# Patient Record
Sex: Male | Born: 1955 | Race: Black or African American | Hispanic: No | Marital: Married | Smoking: Never smoker
Health system: Southern US, Community
[De-identification: ages and names within clinical notes are randomized; demographics above are authoritative.]

## PROBLEM LIST (undated history)

## (undated) DIAGNOSIS — I509 Heart failure, unspecified: Secondary | ICD-10-CM

## (undated) DIAGNOSIS — I10 Essential (primary) hypertension: Secondary | ICD-10-CM

## (undated) HISTORY — DX: Heart failure, unspecified: I50.9

## (undated) HISTORY — DX: Essential (primary) hypertension: I10

---

## 2015-06-18 DIAGNOSIS — G4733 Obstructive sleep apnea (adult) (pediatric): Secondary | ICD-10-CM | POA: Insufficient documentation

## 2015-12-20 DIAGNOSIS — I10 Essential (primary) hypertension: Secondary | ICD-10-CM | POA: Insufficient documentation

## 2017-11-11 ENCOUNTER — Emergency Department: Payer: BLUE CROSS/BLUE SHIELD

## 2017-11-11 ENCOUNTER — Emergency Department
Admission: EM | Admit: 2017-11-11 | Discharge: 2017-11-11 | Disposition: A | Discharge status: Home / Self Care | Payer: BLUE CROSS/BLUE SHIELD | Attending: Student in an Organized Health Care Education/Training Program | Admitting: Student in an Organized Health Care Education/Training Program

## 2017-11-11 ENCOUNTER — Other Ambulatory Visit: Payer: Self-pay

## 2017-11-11 DIAGNOSIS — K5900 Constipation, unspecified: Secondary | ICD-10-CM | POA: Diagnosis present

## 2017-11-11 DIAGNOSIS — K602 Anal fissure, unspecified: Secondary | ICD-10-CM

## 2017-11-11 DIAGNOSIS — K600 Acute anal fissure: Secondary | ICD-10-CM | POA: Insufficient documentation

## 2017-11-11 MED ORDER — BISACODYL 10 MG RE SUPP
10.0000 mg | Freq: Once | RECTAL | Status: AC
  Administered 2017-11-11: 10 mg via RECTAL
  Filled 2017-11-11: qty 1

## 2017-11-11 MED ORDER — POLYETHYLENE GLYCOL 3350 17 G PO PACK: 17.0000 g | PACK | Freq: Every day | ORAL | 0 refills | Status: DC

## 2017-11-11 NOTE — ED Triage Notes (Signed)
Pt in with co constipation for 2 days. States normally has BM daily. Co rectal pain when trying have BM and blood only when he wipes. No abd pain, has not tried any otc meds.

## 2017-11-11 NOTE — ED Provider Notes (Signed)
[facility] Emergency Department Provider Note    None    (approximate)  I have reviewed the triage vital signs and the nursing notes.   HISTORY  Chief Complaint Constipation    HPI Jonathan Duncan is a 63 y.o. male presents with chief complaint of constipation and difficulty moving his bowels for the past 2 days.  Typically states that he is normally regular but he was having trouble moving his bowels and had to try to use the restroom twice today.  States the feelings that he was straining had some pain in his bottom and then when he went to wipe he had noticed blood on the tissue paper twice.  No vomiting.  No melena.  No fevers.  No changes any medications.  No past medical history on file. No family history on file.  There are no active problems to display for this patient.     Prior to Admission medications   Not on File    Allergies Patient has no known allergies.    Social History Social History   Tobacco Use  . Smoking status: Not on file  Substance Use Topics  . Alcohol use: Not on file  . Drug use: Not on file    Review of Systems Patient denies headaches, rhinorrhea, blurry vision, numbness, shortness of breath, chest pain, edema, cough, abdominal pain, nausea, vomiting, diarrhea, dysuria, fevers, rashes or hallucinations unless otherwise stated above in HPI. ____________________________________________   PHYSICAL EXAM:  VITAL SIGNS: Vitals:   11/11/17 1934 11/11/17 1935  BP: (!) 138/113 137/77  Pulse:    Resp:    Temp:    SpO2:      Constitutional: Alert and oriented. Well appearing and in no acute distress. Eyes: Conjunctivae are normal.  Head: Atraumatic. Nose: No congestion/rhinnorhea. Mouth/Throat: Mucous membranes are moist.   Neck: No stridor. Painless ROM.  Cardiovascular: Normal rate, regular rhythm. Grossly normal heart sounds.  Good peripheral circulation. Respiratory: Normal respiratory effort.  No  retractions. Lungs CTAB. Gastrointestinal: Soft and nontender. No distention. No abdominal bruits. No CVA tenderness. Genitourinary: Normal external genitalia with small nonthrombosed hemorrhoid.  Probable anal fissure she does have some pain on digital exam but no fecal impaction. Musculoskeletal: No lower extremity tenderness nor edema.  No joint effusions. Neurologic:  Normal speech and language. No gross focal neurologic deficits are appreciated. No facial droop Skin:  Skin is warm, dry and intact. No rash noted. Psychiatric: Mood and affect are normal. Speech and behavior are normal.  ____________________________________________   LABS (all labs ordered are listed, but only abnormal results are displayed)  No results found for this or any previous visit (from the past 24 hour(s)). ____________________________________________ ____________________________________________  RADIOLOGY  I personally reviewed all radiographic images ordered to evaluate for the above acute complaints and reviewed radiology reports and findings.  These findings were personally discussed with the patient.  Please see medical record for radiology report. ____________________________________________   PROCEDURES  Procedure(s) performed:  Procedures    Critical Care performed: no ____________________________________________   INITIAL IMPRESSION / ASSESSMENT AND PLAN / ED COURSE  Pertinent labs & imaging results that were available during my care of the patient were reviewed by me and considered in my medical decision making (see chart for details).  DDX: Proctitis, constipation, anal fissure, hemorrhoid, diverticular bleed  Jonathan Duncan is a 62 y.o. who presents to the ED with symptoms as described above.  Most consistent with constipation with anal fissure based on exam.  His abdominal exam is soft and benign.  Patient does endorse decreased fluid intake over the past few days.  X-ray shows no  obstructive process.  Amount of blood seems scant and minimal.  There is no blood on digital exam therefore I do believe that this is appropriate for trial of outpatient management.  Have discussed with the patient and available family all diagnostics and treatments performed thus far and all questions were answered to the best of my ability. The patient demonstrates understanding and agreement with plan.       ____________________________________________   FINAL CLINICAL IMPRESSION(S) / ED DIAGNOSES  Final diagnoses:  Constipation, unspecified constipation type  Anal fissure      NEW MEDICATIONS STARTED DURING THIS VISIT:  New Prescriptions   No medications on file     Note:  This document was prepared using Dragon voice recognition software and may include unintentional dictation errors.    Jonathan Duncan, Jonathan Hovanec, MD 11/11/17 2116

## 2017-11-11 NOTE — Discharge Instructions (Signed)
Anal fissure may occur when passing hard or large stools.  An anal fissure can cause pain and bleeding during bowel movements.  This condition usually heals on its own in four to six weeks. Common treatments include dietary fiber, stool softeners, as well as creams to the affected area.

## 2017-11-11 NOTE — ED Notes (Signed)
Rectal exam performed by Dr. Roxan Hockeyobinson and this nurse assisted.

## 2021-04-17 ENCOUNTER — Emergency Department: Payer: Self-pay

## 2021-04-17 ENCOUNTER — Inpatient Hospital Stay
Admission: EM | Admit: 2021-04-17 | Discharge: 2021-04-19 | DRG: 291 | Disposition: A | Discharge status: Home / Self Care | Payer: Self-pay | Attending: Internal Medicine | Admitting: Internal Medicine

## 2021-04-17 ENCOUNTER — Other Ambulatory Visit: Payer: Self-pay

## 2021-04-17 DIAGNOSIS — R778 Other specified abnormalities of plasma proteins: Secondary | ICD-10-CM

## 2021-04-17 DIAGNOSIS — I248 Other forms of acute ischemic heart disease: Secondary | ICD-10-CM | POA: Diagnosis present

## 2021-04-17 DIAGNOSIS — I509 Heart failure, unspecified: Secondary | ICD-10-CM

## 2021-04-17 DIAGNOSIS — J9601 Acute respiratory failure with hypoxia: Secondary | ICD-10-CM | POA: Diagnosis present

## 2021-04-17 DIAGNOSIS — J810 Acute pulmonary edema: Secondary | ICD-10-CM

## 2021-04-17 DIAGNOSIS — Z9114 Patient's other noncompliance with medication regimen: Secondary | ICD-10-CM

## 2021-04-17 DIAGNOSIS — N1831 Chronic kidney disease, stage 3a: Secondary | ICD-10-CM | POA: Diagnosis present

## 2021-04-17 DIAGNOSIS — I161 Hypertensive emergency: Secondary | ICD-10-CM | POA: Diagnosis present

## 2021-04-17 DIAGNOSIS — E876 Hypokalemia: Secondary | ICD-10-CM | POA: Diagnosis present

## 2021-04-17 DIAGNOSIS — Z9119 Patient's noncompliance with other medical treatment and regimen: Secondary | ICD-10-CM

## 2021-04-17 DIAGNOSIS — I447 Left bundle-branch block, unspecified: Secondary | ICD-10-CM | POA: Diagnosis present

## 2021-04-17 DIAGNOSIS — I5023 Acute on chronic systolic (congestive) heart failure: Secondary | ICD-10-CM | POA: Diagnosis present

## 2021-04-17 DIAGNOSIS — Z8249 Family history of ischemic heart disease and other diseases of the circulatory system: Secondary | ICD-10-CM

## 2021-04-17 DIAGNOSIS — Z79899 Other long term (current) drug therapy: Secondary | ICD-10-CM

## 2021-04-17 DIAGNOSIS — I130 Hypertensive heart and chronic kidney disease with heart failure and stage 1 through stage 4 chronic kidney disease, or unspecified chronic kidney disease: Principal | ICD-10-CM | POA: Diagnosis present

## 2021-04-17 DIAGNOSIS — Z20822 Contact with and (suspected) exposure to covid-19: Secondary | ICD-10-CM | POA: Diagnosis present

## 2021-04-17 DIAGNOSIS — I429 Cardiomyopathy, unspecified: Secondary | ICD-10-CM | POA: Diagnosis present

## 2021-04-17 LAB — CBC
HCT: 40.6 % (ref 39.0–52.0)
Hemoglobin: 14.4 g/dL (ref 13.0–17.0)
MCH: 32.9 pg (ref 26.0–34.0)
MCHC: 35.5 g/dL (ref 30.0–36.0)
MCV: 92.7 fL (ref 80.0–100.0)
Platelets: 211 10*3/uL (ref 150–400)
RBC: 4.38 MIL/uL (ref 4.22–5.81)
RDW: 12.4 % (ref 11.5–15.5)
WBC: 9.5 10*3/uL (ref 4.0–10.5)
nRBC: 0 % (ref 0.0–0.2)

## 2021-04-17 LAB — TROPONIN I (HIGH SENSITIVITY)
Troponin I (High Sensitivity): 78 ng/L — ABNORMAL HIGH (ref <18)
Troponin I (High Sensitivity): 93 ng/L — ABNORMAL HIGH (ref <18)

## 2021-04-17 LAB — RESP PANEL BY RT-PCR (FLU A&B, COVID) ARPGX2
Influenza A by PCR: NEGATIVE
Influenza B by PCR: NEGATIVE
SARS Coronavirus 2 by RT PCR: NEGATIVE

## 2021-04-17 LAB — COMPREHENSIVE METABOLIC PANEL
ALT: 15 U/L (ref 0–44)
AST: 24 U/L (ref 15–41)
Albumin: 4 g/dL (ref 3.5–5.0)
Alkaline Phosphatase: 76 U/L (ref 38–126)
Anion gap: 10 (ref 5–15)
BUN: 19 mg/dL (ref 8–23)
CO2: 24 mmol/L (ref 22–32)
Calcium: 8.9 mg/dL (ref 8.9–10.3)
Chloride: 104 mmol/L (ref 98–111)
Creatinine, Ser: 1.28 mg/dL — ABNORMAL HIGH (ref 0.61–1.24)
GFR, Estimated: >60 mL/min (ref >60)
Glucose, Bld: 105 mg/dL — ABNORMAL HIGH (ref 70–99)
Potassium: 3.2 mmol/L — ABNORMAL LOW (ref 3.5–5.1)
Sodium: 138 mmol/L (ref 135–145)
Total Bilirubin: 1.1 mg/dL (ref 0.3–1.2)
Total Protein: 7.9 g/dL (ref 6.5–8.1)

## 2021-04-17 LAB — HIV ANTIBODY (ROUTINE TESTING W REFLEX): HIV Screen 4th Generation wRfx: NONREACTIVE

## 2021-04-17 LAB — PROTIME-INR
INR: 1 (ref 0.8–1.2)
Prothrombin Time: 13.5 seconds (ref 11.4–15.2)

## 2021-04-17 LAB — HEPARIN LEVEL (UNFRACTIONATED): Heparin Unfractionated: 0.1 IU/mL — ABNORMAL LOW (ref 0.30–0.70)

## 2021-04-17 LAB — TSH: TSH: 2.024 u[IU]/mL (ref 0.350–4.500)

## 2021-04-17 LAB — APTT: aPTT: 36 seconds (ref 24–36)

## 2021-04-17 LAB — MAGNESIUM: Magnesium: 2.1 mg/dL (ref 1.7–2.4)

## 2021-04-17 LAB — BRAIN NATRIURETIC PEPTIDE: B Natriuretic Peptide: 653.7 pg/mL — ABNORMAL HIGH (ref 0.0–100.0)

## 2021-04-17 MED ORDER — LABETALOL HCL 5 MG/ML IV SOLN
20.0000 mg | INTRAVENOUS | Status: DC | PRN
  Administered 2021-04-17: 20 mg via INTRAVENOUS
  Filled 2021-04-17: qty 4

## 2021-04-17 MED ORDER — ASPIRIN 81 MG PO CHEW
324.0000 mg | CHEWABLE_TABLET | Freq: Once | ORAL | Status: AC
  Administered 2021-04-17: 324 mg via ORAL
  Filled 2021-04-17: qty 4

## 2021-04-17 MED ORDER — HEPARIN BOLUS VIA INFUSION
4000.0000 [IU] | Freq: Once | INTRAVENOUS | Status: AC
  Administered 2021-04-17: 4000 [IU] via INTRAVENOUS
  Filled 2021-04-17: qty 4000

## 2021-04-17 MED ORDER — HYDRALAZINE HCL 50 MG PO TABS
50.0000 mg | ORAL_TABLET | Freq: Three times a day (TID) | ORAL | Status: DC
  Administered 2021-04-17 – 2021-04-19 (×6): 50 mg via ORAL
  Filled 2021-04-17 (×6): qty 1

## 2021-04-17 MED ORDER — ASPIRIN EC 81 MG PO TBEC
81.0000 mg | DELAYED_RELEASE_TABLET | Freq: Every day | ORAL | Status: DC
  Administered 2021-04-18 – 2021-04-19 (×2): 81 mg via ORAL
  Filled 2021-04-17 (×2): qty 1

## 2021-04-17 MED ORDER — CARVEDILOL 6.25 MG PO TABS
6.2500 mg | ORAL_TABLET | Freq: Two times a day (BID) | ORAL | Status: DC
  Administered 2021-04-17 – 2021-04-19 (×4): 6.25 mg via ORAL
  Filled 2021-04-17 (×5): qty 1

## 2021-04-17 MED ORDER — SACUBITRIL-VALSARTAN 24-26 MG PO TABS
1.0000 | ORAL_TABLET | Freq: Two times a day (BID) | ORAL | Status: DC
  Administered 2021-04-17 – 2021-04-19 (×5): 1 via ORAL
  Filled 2021-04-17 (×6): qty 1

## 2021-04-17 MED ORDER — SODIUM CHLORIDE 0.9% FLUSH
3.0000 mL | Freq: Two times a day (BID) | INTRAVENOUS | Status: DC
  Administered 2021-04-17 – 2021-04-19 (×4): 3 mL via INTRAVENOUS

## 2021-04-17 MED ORDER — NITROGLYCERIN 2 % TD OINT
1.0000 [in_us] | TOPICAL_OINTMENT | Freq: Once | TRANSDERMAL | Status: AC
  Administered 2021-04-17: 1 [in_us] via TOPICAL
  Filled 2021-04-17: qty 1

## 2021-04-17 MED ORDER — FUROSEMIDE 10 MG/ML IJ SOLN
40.0000 mg | Freq: Two times a day (BID) | INTRAMUSCULAR | Status: DC
  Administered 2021-04-17 – 2021-04-19 (×4): 40 mg via INTRAVENOUS
  Filled 2021-04-17 (×4): qty 4

## 2021-04-17 MED ORDER — SODIUM CHLORIDE 0.9% FLUSH: 3.0000 mL | INTRAVENOUS | Status: DC | PRN

## 2021-04-17 MED ORDER — POTASSIUM CHLORIDE CRYS ER 20 MEQ PO TBCR
40.0000 meq | EXTENDED_RELEASE_TABLET | Freq: Once | ORAL | Status: AC
  Administered 2021-04-17: 40 meq via ORAL
  Filled 2021-04-17: qty 2

## 2021-04-17 MED ORDER — POTASSIUM CHLORIDE CRYS ER 20 MEQ PO TBCR
20.0000 meq | EXTENDED_RELEASE_TABLET | Freq: Two times a day (BID) | ORAL | Status: DC
  Administered 2021-04-17 – 2021-04-18 (×4): 20 meq via ORAL
  Filled 2021-04-17 (×4): qty 1

## 2021-04-17 MED ORDER — FUROSEMIDE 10 MG/ML IJ SOLN
60.0000 mg | Freq: Once | INTRAMUSCULAR | Status: AC
  Administered 2021-04-17: 60 mg via INTRAVENOUS
  Filled 2021-04-17: qty 8

## 2021-04-17 MED ORDER — HEPARIN (PORCINE) 25000 UT/250ML-% IV SOLN
1700.0000 [IU]/h | INTRAVENOUS | Status: DC
Start: 2021-04-17 — End: 2021-04-19
  Administered 2021-04-17: 10:00:00 1200 [IU]/h via INTRAVENOUS
  Administered 2021-04-18: 1700 [IU]/h via INTRAVENOUS
  Administered 2021-04-18: 1500 [IU]/h via INTRAVENOUS
  Filled 2021-04-17 (×3): qty 250

## 2021-04-17 MED ORDER — ONDANSETRON HCL 4 MG/2ML IJ SOLN
4.0000 mg | Freq: Four times a day (QID) | INTRAMUSCULAR | Status: DC | PRN
  Administered 2021-04-19: 4 mg via INTRAVENOUS
  Filled 2021-04-17: qty 2

## 2021-04-17 MED ORDER — ACETAMINOPHEN 325 MG PO TABS: 650.0000 mg | ORAL_TABLET | ORAL | Status: DC | PRN

## 2021-04-17 MED ORDER — LABETALOL HCL 5 MG/ML IV SOLN
10.0000 mg | Freq: Once | INTRAVENOUS | Status: AC
  Administered 2021-04-17: 10 mg via INTRAVENOUS
  Filled 2021-04-17: qty 4

## 2021-04-17 MED ORDER — SODIUM CHLORIDE 0.9 % IV SOLN: 250.0000 mL | INTRAVENOUS | Status: DC | PRN

## 2021-04-17 MED ORDER — HEPARIN BOLUS VIA INFUSION
3000.0000 [IU] | Freq: Once | INTRAVENOUS | Status: AC
  Administered 2021-04-17: 3000 [IU] via INTRAVENOUS
  Filled 2021-04-17: qty 3000

## 2021-04-17 NOTE — ED Notes (Signed)
Reina RN aware of assigned bed 

## 2021-04-17 NOTE — ED Notes (Signed)
Informed attending provider of frequent PVCs. Will administer PO hydralazine as BP remains elevated after IV labetalol.

## 2021-04-17 NOTE — ED Provider Notes (Signed)
[facility] Emergency Department Provider Note    Event Date/Time   First MD Initiated Contact with Patient 04/17/21 [phone]     (approximate)  I have reviewed the triage vital signs and the nursing notes.   HISTORY  Chief Complaint Shortness of Breath    HPI Jonathan Duncan is a 65 y.o. male with no significant past medical history presents to the ER for worsening orthopnea shortness of breath exertional dyspnea developed over the past few days.  Has not had any chest pain.  Denies any history of heart disease or lung disease.  Does not currently smoke.  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  Has not been around anyone that he knows of or is aware of his being sick with COVID.  History reviewed. No pertinent past medical history. No family history on file. History reviewed. No pertinent surgical history. There are no problems to display for this patient.     Prior to Admission medications   Medication Sig Start Date End Date Taking? Authorizing Provider  polyethylene glycol (MIRALAX / GLYCOLAX) packet Take 17 g by mouth daily. Mix one tablespoon with 8oz of your favorite juice or water every day until you are having soft formed stools. Then start taking once daily if you didn't have a stool the day before. 11/11/17   Willy Eddy, MD    Allergies Patient has no known allergies.    Social History    Review of Systems Patient denies headaches, rhinorrhea, blurry vision, numbness, shortness of breath, chest pain, edema, cough, abdominal pain, nausea, vomiting, diarrhea, dysuria, fevers, rashes or hallucinations unless otherwise stated above in HPI. ____________________________________________   PHYSICAL EXAM:  VITAL SIGNS: Vitals:   04/17/21 0907 04/17/21 0915  BP:    Pulse: (!) 106 (!) 103  Resp:    Temp:    SpO2: (!) 88% 92%     Constitutional: Alert and oriented.  Eyes: Conjunctivae are normal.  Head: Atraumatic. Nose: No  congestion/rhinnorhea. Mouth/Throat: Mucous membranes are moist.   Neck: No stridor. Painless ROM.  Cardiovascular: Normal rate, regular rhythm. Grossly normal heart sounds.  Good peripheral circulation. Respiratory: Normal respiratory effort.  No retractions. Lungs with diffuse inspiratory crackles Gastrointestinal: Soft and nontender. No distention. No abdominal bruits. No CVA tenderness. Genitourinary:  Musculoskeletal: No lower extremity tenderness nor edema.  No joint effusions. Neurologic:  Normal speech and language. No gross focal neurologic deficits are appreciated. No facial droop Skin:  Skin is warm, dry and intact. No rash noted. Psychiatric: Mood and affect are normal. Speech and behavior are normal.  ____________________________________________   LABS (all labs ordered are listed, but only abnormal results are displayed)  Results for orders placed or performed during the hospital encounter of 04/17/21 (from the past 24 hour(s))  Brain natriuretic peptide     Status: Abnormal   Collection Time: 04/17/21  8:23 AM  Result Value Ref Range   B Natriuretic Peptide 653.7 (H) 0.0 - 100.0 pg/mL  CBC     Status: None   Collection Time: 04/17/21  8:23 AM  Result Value Ref Range   WBC 9.5 4.0 - 10.5 K/uL   RBC 4.38 4.22 - 5.81 MIL/uL   Hemoglobin 14.4 13.0 - 17.0 g/dL   HCT 37.1 06.2 - 69.4 %   MCV 92.7 80.0 - 100.0 fL   MCH 32.9 26.0 - 34.0 pg   MCHC 35.5 30.0 - 36.0 g/dL   RDW 85.4 62.7 - 03.5 %   Platelets 211 150 -  400 K/uL   nRBC 0.0 0.0 - 0.2 %  Troponin I (High Sensitivity)     Status: Abnormal   Collection Time: 04/17/21  8:23 AM  Result Value Ref Range   Troponin I (High Sensitivity) 78 (H) <18 ng/L  Comprehensive metabolic panel     Status: Abnormal   Collection Time: 04/17/21  8:23 AM  Result Value Ref Range   Sodium 138 135 - 145 mmol/L   Potassium 3.2 (L) 3.5 - 5.1 mmol/L   Chloride 104 98 - 111 mmol/L   CO2 24 22 - 32 mmol/L   Glucose, Bld 105 (H) 70 - 99  mg/dL   BUN 19 8 - 23 mg/dL   Creatinine, Ser 8.25 (H) 0.61 - 1.24 mg/dL   Calcium 8.9 8.9 - 05.3 mg/dL   Total Protein 7.9 6.5 - 8.1 g/dL   Albumin 4.0 3.5 - 5.0 g/dL   AST 24 15 - 41 U/L   ALT 15 0 - 44 U/L   Alkaline Phosphatase 76 38 - 126 U/L   Total Bilirubin 1.1 0.3 - 1.2 mg/dL   GFR, Estimated >97 >67 mL/min   Anion gap 10 5 - 15  Resp Panel by RT-PCR (Flu A&B, Covid) Nasopharyngeal Swab     Status: None   Collection Time: 04/17/21  8:32 AM   Specimen: Nasopharyngeal Swab; Nasopharyngeal(NP) swabs in vial transport medium  Result Value Ref Range   SARS Coronavirus 2 by RT PCR NEGATIVE NEGATIVE   Influenza A by PCR NEGATIVE NEGATIVE   Influenza B by PCR NEGATIVE NEGATIVE   ____________________________________________  EKG My review and personal interpretation at Time: 8:00   Indication: sob  Rate: 100  Rhythm: sinus Axis: normal Other: lbbb, nonspecific st changes ____________________________________________  RADIOLOGY  I personally reviewed all radiographic images ordered to evaluate for the above acute complaints and reviewed radiology reports and findings.  These findings were personally discussed with the patient.  Please see medical record for radiology report.  ____________________________________________   PROCEDURES  Procedure(s) performed:  .Critical Care  Date/Time: 04/17/2021 9:35 AM Performed by: Willy Eddy, MD Authorized by: Willy Eddy, MD   Critical care provider statement:    Critical care time (minutes):  35   Critical care time was exclusive of:  Separately billable procedures and treating other patients   Critical care was necessary to treat or prevent imminent or life-threatening deterioration of the following conditions:  Cardiac failure   Critical care was time spent personally by me on the following activities:  Development of treatment plan with patient or surrogate, discussions with consultants, evaluation of patient's  response to treatment, examination of patient, obtaining history from patient or surrogate, ordering and performing treatments and interventions, ordering and review of laboratory studies, ordering and review of radiographic studies, pulse oximetry, re-evaluation of patient's condition and review of old charts    Critical Care performed: yes ____________________________________________   INITIAL IMPRESSION / ASSESSMENT AND PLAN / ED COURSE  Pertinent labs & imaging results that were available during my care of the patient were reviewed by me and considered in my medical decision making (see chart for details).   DDX: Asthma, copd, CHF, pna, ptx, malignancy, Pe, anemia   Levie Wages is a 65 y.o. who presents to the ED with shortness of breath and presentation as described above.  Not complaining of any active chest pain but his exam and chest x-ray are concerning for new onset pulmonary edema suggesting of congestive heart failure.  His troponin  is elevated his BNP is elevated.  EKG with left bundle no scar Bosa criteria.  He is hypertensive with no history of hypertension not on antihypertensive medication I suspect this is likely hypertensive cardiomyopathy but given his elevated troponin new onset symptoms will give aspirin and heparinize due to concern for possible ischemic cardiomyopathy.  He was hypoxic down to 88% on room air placed on 2 L nasal cannula.  Nitro was ordered for blood pressure control as well as IV Lasix for diuresis.  Patient will require hospitalization for further medical management.     The patient was evaluated in Emergency Department today for the symptoms described in the history of present illness. He/she was evaluated in the context of the global COVID-19 pandemic, which necessitated consideration that the patient might be at risk for infection with the SARS-CoV-2 virus that causes COVID-19. Institutional protocols and algorithms that pertain to the evaluation of  patients at risk for COVID-19 are in a state of rapid change based on information released by regulatory bodies including the CDC and federal and state organizations. These policies and algorithms were followed during the patient's care in the ED.  As part of my medical decision making, I reviewed the following data within the electronic MEDICAL RECORD NUMBER Nursing notes reviewed and incorporated, Labs reviewed, notes from prior ED visits and [place] Controlled Substance Database   ____________________________________________   FINAL CLINICAL IMPRESSION(S) / ED DIAGNOSES  Final diagnoses:  Acute respiratory failure with hypoxia (HCC)  Acute pulmonary edema (HCC)      NEW MEDICATIONS STARTED DURING THIS VISIT:  New Prescriptions   No medications on file     Note:  This document was prepared using Dragon voice recognition software and may include unintentional dictation errors.    Willy Eddy, MD 04/17/21 [phone]

## 2021-04-17 NOTE — ED Notes (Signed)
Heparin bolus and new rate verified by this RN and Ship broker.

## 2021-04-17 NOTE — ED Notes (Signed)
Pt taken to Xray and then XRay states they will take pt to ED 9

## 2021-04-17 NOTE — Consult Note (Signed)
ANTICOAGULATION CONSULT NOTE - Initial Consult  Pharmacy Consult for heparin Indication: chest pain/ACS  No Known Allergies  Patient Measurements: Height: 6' (182.9 cm) Weight: 95.3 kg (210 lb) IBW/kg (Calculated) : 77.6 Heparin Dosing Weight: 95.3 kg  Vital Signs: Temp: 98 F (36.7 C) (08/11 0755) BP: 183/115 (08/11 0755) Pulse Rate: 103 (08/11 0915)  Labs: Recent Labs    04/17/21 0823  HGB 14.4  HCT 40.6  PLT 211  CREATININE 1.28*  TROPONINIHS 78*    Estimated Creatinine Clearance: 71.7 mL/min (A) (by C-G formula based on SCr of 1.28 mg/dL (H)).   Medical History: History reviewed. No pertinent past medical history.  Medications:  (Not in a hospital admission)  Scheduled:  Infusions:  PRN:  Anti-infectives (From admission, onward)    None       Assessment: Pharmacy consulted to start heparin for ACS. No DOAC noted PTA. HS Trop 78. No note of chest pain.   Goal of Therapy:  Heparin level 0.3-0.7 units/ml Monitor platelets by anticoagulation protocol: Yes   Plan:  Give 4000 units bolus x 1 Start heparin infusion at 1200 units/hr Check anti-Xa level in 6 hours and daily while on heparin Continue to monitor H&H and platelets  Ronnald Ramp, PharmD, BCPS 04/17/2021,9:38 AM

## 2021-04-17 NOTE — Progress Notes (Signed)
Pt transferred from ED to room 256. A&Ox4, no pain. VSS. Heparin gtt infusing at 1500 units. Telemetry applied to pt and verified with CCMD. Pt and family oriented to room. Wife complaining room looks "cruddy". Discussed with Charge RN about a new room. PT to transfer to room 260 once cleaned. Will report off to night shift RN.

## 2021-04-17 NOTE — ED Notes (Signed)
Pt states increasing SOB, pt was currently on 2L/min via [place], pt bumped up to 3L/min at this time, pt states "more relief" and pt was also repositioned to high fowlers to help with lung expansion.

## 2021-04-17 NOTE — ED Notes (Signed)
Cardiac monitoring stating pt was in v-tach, pt denies chest pain or palpitations. New EKG printed and reviewed with Dr. Roxan Hockey, no new orders, pt stable and in NAD at this time.

## 2021-04-17 NOTE — ED Triage Notes (Addendum)
Pt comes with c/o SOB that started few days ago. Pt denies any CP or pain.  Pt states he is fine throughout the day then at night when he lays down it is worse. Pt states this am when he woke up he still felt SOB.

## 2021-04-17 NOTE — ED Notes (Signed)
Pt BP still elevated despite Nitro paste and pt diuresing, MD aware, new orders, see MAR.

## 2021-04-17 NOTE — ED Notes (Signed)
Pt presents to the ED today with CC of SOB that has been intermittently getting worse overt he past few days. Pt states he has noticed today the SOB episode lasted longer and "scared him". Pt denies any underlying lung issues such as asthma, COPD, or CHF at this time. Pt has minimal BLE swelling at this time, no pitting edema noted.   Pt states he is unsure at this time what makes his SOB worse or better. Pt does not appear to be NAD at this time. Pt is A&Ox4.  Pt presents hypertension with no HX per PT.

## 2021-04-17 NOTE — Consult Note (Signed)
ANTICOAGULATION CONSULT NOTE - Initial Consult  Pharmacy Consult for heparin Indication: chest pain/ACS  No Known Allergies  Patient Measurements: Height: 6' (182.9 cm) Weight: 95.3 kg (210 lb) IBW/kg (Calculated) : 77.6 Heparin Dosing Weight: 95.3 kg  Vital Signs: Temp: 98 F (36.7 C) (08/11 0755) BP: 161/106 (08/11 1704) Pulse Rate: 79 (08/11 1600)  Labs: Recent Labs    04/17/21 0823 04/17/21 1032 04/17/21 1641  HGB 14.4  --   --   HCT 40.6  --   --   PLT 211  --   --   APTT 36  --   --   LABPROT 13.5  --   --   INR 1.0  --   --   HEPARINUNFRC  --   --  0.10*  CREATININE 1.28*  --   --   TROPONINIHS 78* 93*  --      Estimated Creatinine Clearance: 71.7 mL/min (A) (by C-G formula based on SCr of 1.28 mg/dL (H)).   Medical History: History reviewed. No pertinent past medical history.  Medications:  (Not in a hospital admission) Scheduled:  Infusions:  PRN:  Anti-infectives (From admission, onward)    None       Assessment: Pharmacy consulted to start heparin for ACS. No DOAC noted PTA. HS Trop 78. No note of chest pain.   8/11 1641 HL = 0.10 1200 >1500 units/hr  Goal of Therapy:  Heparin level 0.3-0.7 units/ml Monitor platelets by anticoagulation protocol: Yes   Plan:  8/11 1641 HL = 0.10 , subtherapeutic  Give 3000 units bolus x 1 Increase heparin infusion to 1500 units/hr Check anti-Xa level 6 hours following rate change  Continue to monitor H&H and platelets  Sharen Hones, PharmD, BCPS 04/17/2021,5:32 PM

## 2021-04-17 NOTE — Consult Note (Signed)
Brief cardiology consult note Impression Congestive heart failure Left bundle branch block Shortness of breath Elevated troponins Hypertension Probable obstructive sleep apnea Acute respiratory failure with hypoxia Acute pulmonary edema . Plan Agreed admit to telemetry Rule out myocardial infarction Follow-up EKGs and troponin Echocardiogram for assessment left ventricular function IV diuretic therapy for heart failure Consider ACE ARB or Arni Beta-blockade therapy with Coreg and metoprolol Continue spironolactone if left ventricular function diminished Consider functional study versus cardiac cath Consider sleep study for possible obstructive sleep apnea Supplemental oxygen inhalers as necessary

## 2021-04-17 NOTE — ED Notes (Signed)
Messaged hospitalist to request PRN IV antihypertensive to lower BP per request of floor nurse and floor CN.

## 2021-04-17 NOTE — H&P (Addendum)
History and Physical    Jonathan Duncan OIB:[id number] DOB: [birth year] DOA: 04/17/2021  PCP: [facility], [facility]  Patient coming from:home  I have personally briefly reviewed patient's old medical records in [facility]  Chief Complaint: sob/doe/orthopnea  x 1 week worse over last 3 days  HPI: Jonathan Duncan is a 65 y.o. male with medical history significant of  Hypertension with noncompliance with medication over the last 2 years due to loss to follow up related to insurance issues, who presents with one week of doe , sob, orthopnea. Patient states symptoms was more progressive over the last 3 days until this am where he noted his was significantly sob at rest. Due to acute progression of symptoms he presented to the ED. He notes no chest pain, mild nausea, no palpitations, no presyncope, low lower extremity swelling. He denies previous episodes like this in the past or any other medical conditions other than HTN. He does endorse family history of CAD with brother that died from MI at 73.   ED Course: Vitals: afeb, bp 183/115, hr 95, rr 18,  sat 88%  XIH:WTUUE with pvc , LBBB no previous to compare  Labs: Wbc 9.5, hgb 14.4, plt 211 NA138, K 3.2, glu 105, cr 1.28 base unknown  BNP 653 CE 78, 93 Inr 1 Covid negative  Tx KCL 40, asa, lasix 60,heparin  Review of Systems: As per HPI otherwise 10 point review of systems negative.   PMH HTN  History reviewed. No pertinent surgical history.  SH No tobacco , no etoh   No Known Allergies   FH HTN CAD  Prior to Admission medications   Medication Sig Start Date End Date Taking? Authorizing Provider  Coenzyme Q10 (CO Q 10) 100 MG CAPS Take 100 mg by mouth daily.   Yes [provider]  Multiple Vitamins-Minerals (MULTIVITAMIN GUMMIES MENS) CHEW Chew 1 tablet by mouth daily.   Yes [provider]    Physical Exam: Vitals:   04/17/21 1345 04/17/21 1415 04/17/21 1600 04/17/21 1704  BP: (!)  139/100 (!) 170/100 (!) 159/114 (!) 161/106  Pulse: 73 79 79   Resp: 19 (!) 21 19   Temp:      SpO2: 93% 95% 97%   Weight:      Height:         Vitals:   04/17/21 1345 04/17/21 1415 04/17/21 1600 04/17/21 1704  BP: (!) 139/100 (!) 170/100 (!) 159/114 (!) 161/106  Pulse: 73 79 79   Resp: 19 (!) 21 19   Temp:      SpO2: 93% 95% 97%   Weight:      Height:      Constitutional: NAD, calm, comfortable Eyes: PERRL, lids and conjunctivae normal ENMT: Mucous membranes are moist. Posterior pharynx clear of any exudate or lesions.Normal dentition.  Neck: normal, supple, no masses, no thyromegaly Respiratory: no wheezing, +crackles. Normal respiratory effort. No accessory muscle use.  Cardiovascular: Regular rate and rhythm, no murmurs / rubs / gallops. No extremity edema. 2+ pedal pulses. No carotid bruits.  Abdomen: no tenderness, no masses palpated. No hepatosplenomegaly. Bowel sounds positive.  Musculoskeletal: no clubbing / cyanosis. No joint deformity upper and lower extremities. Good ROM, no contractures. Normal muscle tone.  Skin: no rashes, lesions, ulcers. No induration Neurologic: CN 2-12 grossly intact. Sensation intact Strength 5/5 in all 4.  Psychiatric: Normal judgment and insight. Alert and oriented x 3. Normal mood.    Labs on Admission: I have personally reviewed  following labs and imaging studies  CBC: Recent Labs  Lab 04/17/21 0823  WBC 9.5  HGB 14.4  HCT 40.6  MCV 92.7  PLT 211   Basic Metabolic Panel: Recent Labs  Lab 04/17/21 0823  NA 138  K 3.2*  CL 104  CO2 24  GLUCOSE 105*  BUN 19  CREATININE 1.28*  CALCIUM 8.9   GFR: Estimated Creatinine Clearance: 71.7 mL/min (A) (by C-G formula based on SCr of 1.28 mg/dL (H)). Liver Function Tests: Recent Labs  Lab 04/17/21 0823  AST 24  ALT 15  ALKPHOS 76  BILITOT 1.1  PROT 7.9  ALBUMIN 4.0   No results for input(s): LIPASE, AMYLASE in the last 168 hours. No results for input(s): AMMONIA in the  last 168 hours. Coagulation Profile: Recent Labs  Lab 04/17/21 0823  INR 1.0   Cardiac Enzymes: No results for input(s): CKTOTAL, CKMB, CKMBINDEX, TROPONINI in the last 168 hours. BNP (last 3 results) No results for input(s): PROBNP in the last 8760 hours. HbA1C: No results for input(s): HGBA1C in the last 72 hours. CBG: No results for input(s): GLUCAP in the last 168 hours. Lipid Profile: No results for input(s): CHOL, HDL, LDLCALC, TRIG, CHOLHDL, LDLDIRECT in the last 72 hours. Thyroid Function Tests: No results for input(s): TSH, T4TOTAL, FREET4, T3FREE, THYROIDAB in the last 72 hours. Anemia Panel: No results for input(s): VITAMINB12, FOLATE, FERRITIN, TIBC, IRON, RETICCTPCT in the last 72 hours. Urine analysis: No results found for: COLORURINE, APPEARANCEUR, LABSPEC, PHURINE, GLUCOSEU, HGBUR, BILIRUBINUR, KETONESUR, PROTEINUR, UROBILINOGEN, NITRITE, LEUKOCYTESUR  Radiological Exams on Admission: DG Chest 2 View  Result Date: 04/17/2021 CLINICAL DATA:  Shortness of breath EXAM: CHEST - 2 VIEW COMPARISON:  None. FINDINGS: Mildly enlarged cardiac silhouette. Lower lobe predominant, prominent interstitial opacities, with blunting of left costophrenic angle. No acute osseous abnormality. Imaged abdomen is unremarkable. IMPRESSION: Prominent interstitial opacities, most likely pulmonary edema. Electronically Signed   By: Wiliam Ke MD   On: 04/17/2021 08:56    EKG: Independently reviewed.  Assessment/Plan New onset Congestive heart failure with associated acute hypoxic respiratory failure -admit to progressive care  -place on chf protocol  -lasix bid -entresto as able base on renal function  -start coreg low dose titrate up as able once patient is more euvolemic  NSTEMI/ACS -EKG with Left bundle branch block -elevated CE with + delta -presumed type II r/o type 1  -continue heparin  -cycle ce, f/u echo pending  -further risk stratification per cardiology recs     Hypertensive Emergency  -responsive to bolus in ed  -start on hydralazine, carvedilol,  -adjust therapy in am if renal function remain stable  -prn iv anti-htn meds ordered   Hypokalemia: Replete per prn monitor lytes closely  DVT prophylaxis: heparin Code Status: full Family Communication: wife at bedside Disposition Plan: patient  expected to be admitted greater than 2 midnights  Consults called: Callwood MD ,cardiology  Admission status: progressive care   Lurline Del MD [facility]   If 7PM-7AM, please contact night-coverage [email or web address] Password [facility]  04/17/2021, 5:05 PM

## 2021-04-18 ENCOUNTER — Inpatient Hospital Stay
Admit: 2021-04-18 | Discharge: 2021-04-18 | Disposition: A | Discharge status: Home / Self Care | Payer: Self-pay | Attending: Internal Medicine | Admitting: Internal Medicine

## 2021-04-18 DIAGNOSIS — I161 Hypertensive emergency: Secondary | ICD-10-CM

## 2021-04-18 DIAGNOSIS — I5023 Acute on chronic systolic (congestive) heart failure: Secondary | ICD-10-CM

## 2021-04-18 DIAGNOSIS — R778 Other specified abnormalities of plasma proteins: Secondary | ICD-10-CM

## 2021-04-18 LAB — ECHOCARDIOGRAM COMPLETE
AR max vel: 3.61 cm2
AV Area VTI: 3.75 cm2
AV Area mean vel: 3.59 cm2
AV Mean grad: 5 mmHg
AV Peak grad: 9.1 mmHg
Ao pk vel: 1.51 m/s
Area-P 1/2: 5.88 cm2
Calc EF: 33.2 %
Height: 72.008 in
MV VTI: 6.38 cm2
S' Lateral: 4.09 cm
Single Plane A2C EF: 36.9 %
Single Plane A4C EF: 28 %
Weight: 3217.6 oz

## 2021-04-18 LAB — BASIC METABOLIC PANEL
Anion gap: 7 (ref 5–15)
Anion gap: 9 (ref 5–15)
BUN: 20 mg/dL (ref 8–23)
BUN: 21 mg/dL (ref 8–23)
CO2: 27 mmol/L (ref 22–32)
CO2: 28 mmol/L (ref 22–32)
Calcium: 8.9 mg/dL (ref 8.9–10.3)
Calcium: 9.1 mg/dL (ref 8.9–10.3)
Chloride: 105 mmol/L (ref 98–111)
Chloride: 106 mmol/L (ref 98–111)
Creatinine, Ser: 1.42 mg/dL — ABNORMAL HIGH (ref 0.61–1.24)
Creatinine, Ser: 1.48 mg/dL — ABNORMAL HIGH (ref 0.61–1.24)
GFR, Estimated: 53 mL/min — ABNORMAL LOW (ref >60)
GFR, Estimated: 56 mL/min — ABNORMAL LOW (ref >60)
Glucose, Bld: 104 mg/dL — ABNORMAL HIGH (ref 70–99)
Glucose, Bld: 106 mg/dL — ABNORMAL HIGH (ref 70–99)
Potassium: 3.6 mmol/L (ref 3.5–5.1)
Potassium: 3.7 mmol/L (ref 3.5–5.1)
Sodium: 140 mmol/L (ref 135–145)
Sodium: 142 mmol/L (ref 135–145)

## 2021-04-18 LAB — CBC
HCT: 44.4 % (ref 39.0–52.0)
Hemoglobin: 15.3 g/dL (ref 13.0–17.0)
MCH: 31.5 pg (ref 26.0–34.0)
MCHC: 34.5 g/dL (ref 30.0–36.0)
MCV: 91.4 fL (ref 80.0–100.0)
Platelets: 254 10*3/uL (ref 150–400)
RBC: 4.86 MIL/uL (ref 4.22–5.81)
RDW: 12.4 % (ref 11.5–15.5)
WBC: 9.7 10*3/uL (ref 4.0–10.5)
nRBC: 0 % (ref 0.0–0.2)

## 2021-04-18 LAB — MAGNESIUM: Magnesium: 2.4 mg/dL (ref 1.7–2.4)

## 2021-04-18 LAB — HEPARIN LEVEL (UNFRACTIONATED)
Heparin Unfractionated: 0.25 IU/mL — ABNORMAL LOW (ref 0.30–0.70)
Heparin Unfractionated: 0.36 IU/mL (ref 0.30–0.70)
Heparin Unfractionated: 0.42 IU/mL (ref 0.30–0.70)

## 2021-04-18 MED ORDER — CYCLOBENZAPRINE HCL 10 MG PO TABS
10.0000 mg | ORAL_TABLET | Freq: Three times a day (TID) | ORAL | Status: DC | PRN
  Administered 2021-04-18: 10 mg via ORAL
  Filled 2021-04-18: qty 1

## 2021-04-18 MED ORDER — HEPARIN BOLUS VIA INFUSION
1400.0000 [IU] | Freq: Once | INTRAVENOUS | Status: AC
  Administered 2021-04-18: 1400 [IU] via INTRAVENOUS
  Filled 2021-04-18: qty 1400

## 2021-04-18 NOTE — Progress Notes (Signed)
CSW received consult for heart failure, informed Heart Failure RN Jimsey.   [place], [place] [phone]

## 2021-04-18 NOTE — Consult Note (Signed)
ANTICOAGULATION CONSULT NOTE   Pharmacy Consult for heparin Indication: chest pain/ACS  No Known Allergies  Patient Measurements: Height: 6' 0.01" (182.9 cm) Weight: 92.3 kg (203 lb 8 oz) IBW/kg (Calculated) : 77.62 Heparin Dosing Weight: 95.3 kg  Vital Signs: Temp: 98.9 F (37.2 C) (08/11 2030) Temp Source: Oral (08/11 2030) BP: 140/97 (08/11 2030) Pulse Rate: 71 (08/11 2030)  Labs: Recent Labs    04/17/21 0823 04/17/21 1032 04/17/21 1641 04/18/21 0002  HGB 14.4  --   --   --   HCT 40.6  --   --   --   PLT 211  --   --   --   APTT 36  --   --   --   LABPROT 13.5  --   --   --   INR 1.0  --   --   --   HEPARINUNFRC  --   --  0.10* 0.25*  CREATININE 1.28*  --   --  1.42*  TROPONINIHS 78* 93*  --   --      Estimated Creatinine Clearance: 59.2 mL/min (A) (by C-G formula based on SCr of 1.42 mg/dL (H)).   Medical History: History reviewed. No pertinent past medical history.  Medications:  Medications Prior to Admission  Medication Sig Dispense Refill Last Dose   Coenzyme Q10 (CO Q 10) 100 MG CAPS Take 100 mg by mouth daily.      Multiple Vitamins-Minerals (MULTIVITAMIN GUMMIES MENS) CHEW Chew 1 tablet by mouth daily.      Scheduled:  Infusions:  PRN:  Anti-infectives (From admission, onward)    None       Assessment: Pharmacy consulted to start heparin for ACS. No DOAC noted PTA. HS Trop 78. No note of chest pain.   8/11 1641 HL = 0.10 1200 >1500 units/hr 8/12 0002 HL = 0.25, subtherapeutic  Goal of Therapy:  Heparin level 0.3-0.7 units/ml Monitor platelets by anticoagulation protocol: Yes   Plan:  Give 1400 units bolus x 1 Increase heparin infusion to 1700 units/hr Recheck HL 6 hrs following rate change  Continue to monitor H&H and platelets  Otelia Sergeant, PharmD, [facility] 04/18/2021 2:31 AM

## 2021-04-18 NOTE — Progress Notes (Signed)
*  PRELIMINARY RESULTS* Echocardiogram 2D Echocardiogram has been performed.  Jonathan Duncan 04/18/2021, 9:51 AM

## 2021-04-18 NOTE — Consult Note (Signed)
ANTICOAGULATION CONSULT NOTE   Pharmacy Consult for heparin Indication: chest pain/ACS  No Known Allergies  Patient Measurements: Height: 6' 0.01" (182.9 cm) Weight: 91.2 kg (201 lb 1.6 oz) IBW/kg (Calculated) : 77.62 Heparin Dosing Weight: 95.3 kg  Vital Signs: Temp: 98.7 F (37.1 C) (08/12 1145) Temp Source: Oral (08/12 1145) BP: 116/82 (08/12 1145) Pulse Rate: 72 (08/12 1145)  Labs: Recent Labs    04/17/21 0823 04/17/21 1032 04/17/21 1641 04/18/21 0002 04/18/21 0656 04/18/21 0841  HGB 14.4  --   --   --  15.3  --   HCT 40.6  --   --   --  44.4  --   PLT 211  --   --   --  254  --   APTT 36  --   --   --   --   --   LABPROT 13.5  --   --   --   --   --   INR 1.0  --   --   --   --   --   HEPARINUNFRC  --   --  0.10* 0.25*  --  0.42  CREATININE 1.28*  --   --  1.42* 1.48*  --   TROPONINIHS 78* 93*  --   --   --   --      Estimated Creatinine Clearance: 56.8 mL/min (A) (by C-G formula based on SCr of 1.48 mg/dL (H)).   Medical History: History reviewed. No pertinent past medical history.  Medications:  Medications Prior to Admission  Medication Sig Dispense Refill Last Dose   Coenzyme Q10 (CO Q 10) 100 MG CAPS Take 100 mg by mouth daily.      Multiple Vitamins-Minerals (MULTIVITAMIN GUMMIES MENS) CHEW Chew 1 tablet by mouth daily.      Scheduled:  Infusions:  PRN:  Anti-infectives (From admission, onward)    None       Assessment: Pharmacy consulted to start heparin for ACS. No DOAC noted PTA. HS Trop 78. No note of chest pain.   8/11 1641 HL = 0.10 1200 >1500 units/hr 8/12 0002 HL = 0.25, subtherapeutic 8/12 0841 HL = 0.42, therapeutic x1  Goal of Therapy:  Heparin level 0.3-0.7 units/ml Monitor platelets by anticoagulation protocol: Yes   Plan:  Continue heparin infusion at 1700 units/hr Check confirmatory HL in 6 hrs  Continue to monitor H&H and platelets  Bettey Costa, PharmD Clinical Pharmacist 04/18/2021 12:11 PM

## 2021-04-18 NOTE — Progress Notes (Signed)
[facility]    SUBJECTIVE: Patient states shortness of breath much improved no leg swelling no cough no congestion blood pressure is also improved denies any chest pain   Vitals:   04/17/21 2030 04/18/21 0436 04/18/21 0800 04/18/21 1145  BP: (!) 140/97 (!) 136/92 123/88 116/82  Pulse: 71 61 68 72  Resp:  17  15  Temp: 98.9 F (37.2 C) 98.4 F (36.9 C) 98.2 F (36.8 C) 98.7 F (37.1 C)  TempSrc: Oral Oral Oral Oral  SpO2: 98% 95% 97% 99%  Weight:  91.2 kg    Height:         Intake/Output Summary (Last 24 hours) at 04/18/2021 1327 Last data filed at 04/18/2021 1884 Gross per 24 hour  Intake 797.04 ml  Output 2575 ml  Net -1777.96 ml      PHYSICAL EXAM  General: Well developed, well nourished, in no acute distress HEENT:  Normocephalic and atramatic Neck:  No JVD.  Lungs: Clear bilaterally to auscultation and percussion. Heart: HRRR . Normal S1 and S2 without gallops or murmurs.  Abdomen: Bowel sounds are positive, abdomen soft and non-tender  Msk:  Back normal, normal gait. Normal strength and tone for age. Extremities: No clubbing, cyanosis or edema.   Neuro: Alert and oriented X 3. Psych:  Good affect, responds appropriately   LABS: Basic Metabolic Panel: Recent Labs    04/17/21 1641 04/18/21 0002 04/18/21 0656  NA  --  140 142  K  --  3.7 3.6  CL  --  105 106  CO2  --  28 27  GLUCOSE  --  106* 104*  BUN  --  21 20  CREATININE  --  1.42* 1.48*  CALCIUM  --  8.9 9.1  MG 2.1 2.4  --    Liver Function Tests: Recent Labs    04/17/21 0823  AST 24  ALT 15  ALKPHOS 76  BILITOT 1.1  PROT 7.9  ALBUMIN 4.0   No results for input(s): LIPASE, AMYLASE in the last 72 hours. CBC: Recent Labs    04/17/21 0823 04/18/21 0656  WBC 9.5 9.7  HGB 14.4 15.3  HCT 40.6 44.4  MCV 92.7 91.4  PLT 211 254   Cardiac Enzymes: No results for input(s): CKTOTAL, CKMB, CKMBINDEX, TROPONINI in the last 72 hours. BNP: Invalid input(s): POCBNP D-Dimer: No  results for input(s): DDIMER in the last 72 hours. Hemoglobin A1C: No results for input(s): HGBA1C in the last 72 hours. Fasting Lipid Panel: No results for input(s): CHOL, HDL, LDLCALC, TRIG, CHOLHDL, LDLDIRECT in the last 72 hours. Thyroid Function Tests: Recent Labs    04/17/21 1641  TSH 2.024   Anemia Panel: No results for input(s): VITAMINB12, FOLATE, FERRITIN, TIBC, IRON, RETICCTPCT in the last 72 hours.  DG Chest 2 View  Result Date: 04/17/2021 CLINICAL DATA:  Shortness of breath EXAM: CHEST - 2 VIEW COMPARISON:  None. FINDINGS: Mildly enlarged cardiac silhouette. Lower lobe predominant, prominent interstitial opacities, with blunting of left costophrenic angle. No acute osseous abnormality. Imaged abdomen is unremarkable. IMPRESSION: Prominent interstitial opacities, most likely pulmonary edema. Electronically Signed   By: Wiliam Ke MD   On: 04/17/2021 08:56     Echo mildly depressed left ventricular function ejection fraction around 35 to 40%  TELEMETRY: Normal sinus rhythm rate of around 70 nonspecific findings:  ASSESSMENT AND PLAN:  Active Problems:   CHF (congestive heart failure) (HCC) Cardiomyopathy Hypertension Chronic renal insufficiency stage III Shortness of breath Elevated troponin probably demand  ischemia Abnormal EKG with left bundle branch block  Plan Agreed admit to telemetry Follow-up EKGs and troponins This does not represent a non-STEMI this is demand ischemia Agree with echocardiogram for assessment left ventricular function wall motion Agree with heart failure therapy Continue diuretic therapy Agree with Entresto beta-blockers probably add spironolactone Strict blood pressure management and control Consider sleep study for obstructive sleep apnea Refer the patient to nephrology for renal insufficiency We will have the patient follow-up with cardiology as an outpatient   [name], MD, 04/18/2021 1:27 PM

## 2021-04-18 NOTE — Consult Note (Signed)
   Heart failure nurse navigator note.  Heart failure-echocardiogram results pending at this time.  He presented to the emergency room with complaints of dyspnea on exertion, increasing shortness of breath and orthopnea.  Comorbidities:  Hypertension Medication noncompliance   Medications:  Enteric coated aspirin 81 mg Coreg 6.25 mg 2 times a day with meals Furosemide 40 mg IV every 12 Hydralazine 50 mg every 8 hours Potassium chloride 20 mEq Entresto 24/26 mg 2 times a day  Labs:  Sodium 142, potassium 3.6, chloride 106, CO2 27, BUN 20, creatinine 1.48 Intake 370 mL Output 3325 Weight is 91.2 kg down from 92.3 kg  Initial meeting with patient and wife who was at the bedside.  Explained to them that I did not know the function or ejection fraction of his heart.  Discussed systolic and diastolic heart failure.  They voiced understanding.  Discussed the importance of low-sodium diet, not adding salt at the table.  Also discussed restaurant eating.  Stressed the importance of sticking within fluid restriction of 64 ounces in a 24-hour period  Weighing daily at the same time with the same amount of close are in the nude, and recording.  To call physician with 2 pound weight gain overnight or 5 pounds within a week.  They voiced understanding.  Also discussed the medications that he has been placed on and then they can help with the remodeling of his heart.  But that he has to be compliant and take as ordered.  Also discussed the outpatient heart failure clinic with [facility].  Were given the living with heart failure teaching booklet along with low-sodium info and the zone magnet.  Tresa Endo RN [facility]

## 2021-04-18 NOTE — Consult Note (Signed)
ANTICOAGULATION CONSULT NOTE   Pharmacy Consult for heparin Indication: chest pain/ACS  No Known Allergies  Patient Measurements: Height: 6' 0.01" (182.9 cm) Weight: 91.2 kg (201 lb 1.6 oz) IBW/kg (Calculated) : 77.62 Heparin Dosing Weight: 95.3 kg  Vital Signs: Temp: 98.8 F (37.1 C) (08/12 1538) Temp Source: Oral (08/12 1145) BP: 132/82 (08/12 1538) Pulse Rate: 81 (08/12 1538)  Labs: Recent Labs    04/17/21 0823 04/17/21 1032 04/17/21 1641 04/18/21 0002 04/18/21 0656 04/18/21 0841 04/18/21 1510  HGB 14.4  --   --   --  15.3  --   --   HCT 40.6  --   --   --  44.4  --   --   PLT 211  --   --   --  254  --   --   APTT 36  --   --   --   --   --   --   LABPROT 13.5  --   --   --   --   --   --   INR 1.0  --   --   --   --   --   --   HEPARINUNFRC  --   --    < > 0.25*  --  0.42 0.36  CREATININE 1.28*  --   --  1.42* 1.48*  --   --   TROPONINIHS 78* 93*  --   --   --   --   --    < > = values in this interval not displayed.     Estimated Creatinine Clearance: 56.8 mL/min (A) (by C-G formula based on SCr of 1.48 mg/dL (H)).   Medical History: History reviewed. No pertinent past medical history.  Medications:  Medications Prior to Admission  Medication Sig Dispense Refill Last Dose   Coenzyme Q10 (CO Q 10) 100 MG CAPS Take 100 mg by mouth daily.      Multiple Vitamins-Minerals (MULTIVITAMIN GUMMIES MENS) CHEW Chew 1 tablet by mouth daily.      Scheduled:  Infusions:  PRN:  Anti-infectives (From admission, onward)    None       Assessment: Pharmacy consulted to start heparin for ACS. No DOAC noted PTA. HS Trop 78. No note of chest pain.   8/11 1641 HL = 0.10 1200 >1500 units/hr 8/12 0002 HL = 0.25, subtherapeutic 8/12 0841 HL = 0.42, therapeutic x1 8/12 1510 HL = 0.36, therapeutic x2   Goal of Therapy:  Heparin level 0.3-0.7 units/ml Monitor platelets by anticoagulation protocol: Yes   Plan:  HL therapeutic x2, continue heparin infusion at  1700 units/hr Check HL with AM labs Continue to monitor H&H and platelets  Raiford Noble, PharmD Clinical Pharmacist 04/18/2021 4:33 PM

## 2021-04-18 NOTE — Progress Notes (Signed)
PROGRESS NOTE    Jonathan Duncan  [name] DOB: 04/08/1956 DOA: 04/17/2021 PCP: [facility]   Chief complaint for shortness of breath. Brief Narrative:  Jonathan Duncan is a 65 y.o. male with medical history significant of  Hypertension with noncompliance with medication over the last 2 years due to loss to follow up related to insurance issues, who presents with one week of doe , sob, orthopnea.  He has elevated BNP, he is diagnosed with acute on chronic systolic congestive heart failure, was placed on IV Lasix.   Assessment & Plan:   Active Problems:   CHF (congestive heart failure) (HCC)   #1.  Acute on chronic systolic congestive heart failure. Elevated troponin secondary to congestive heart failure. Hypertension emergency. Patient has been evaluated by cardiology.  I personally reviewed patient echocardiogram, significant systolic dysfunction, final results still pending. Patient still has some volume overload, will continue IV lasix today. Monitor renal function and electrolytes. Patient is also on heparin drip for elevated troponin.  #2.  Chronic kidney disease stage IIIa versus acute kidney injury. Patient does not have prior lab, renal function slightly worse after Lasix.  We will continue to monitor renal function.   DVT prophylaxis: Heparin Code Status: full Family Communication:  Disposition Plan:    Status is: Inpatient  Remains inpatient appropriate because:Inpatient level of care appropriate due to severity of illness  Dispo: The patient is from: Home              Anticipated d/c is to: Home              Patient currently is not medically stable to d/c.   Difficult to place patient No        I/O last 3 completed shifts: In: 317 [I.V.:317] Out: 3325 [Urine:3325] Total I/O In: 480 [P.O.:480] Out: 900 [Urine:900]     Consultants:  Cardiology  Procedures: None  Antimicrobials: None  Subjective: Patient doing  well today, short of breath essentially resolved.  No additional chest pain. No abdominal pain or nausea vomiting.  No dysuria hematuria P No fever or chills.  Objective: Vitals:   04/18/21 0436 04/18/21 0800 04/18/21 1145 04/18/21 1538  BP: (!) 136/92 123/88 116/82 132/82  Pulse: 61 68 72 81  Resp: 17  15 16   Temp: 98.4 F (36.9 C) 98.2 F (36.8 C) 98.7 F (37.1 C) 98.8 F (37.1 C)  TempSrc: Oral Oral Oral   SpO2: 95% 97% 99% 96%  Weight: 91.2 kg     Height:        Intake/Output Summary (Last 24 hours) at 04/18/2021 1543 Last data filed at 04/18/2021 0803 Gross per 24 hour  Intake 797.04 ml  Output 2275 ml  Net -1477.96 ml   Filed Weights   04/17/21 1830 04/17/21 1959 04/18/21 0436  Weight: 92.3 kg 92.3 kg 91.2 kg    Examination:  General exam: Appears calm and comfortable  Respiratory system: Clear to auscultation. Respiratory effort normal. Cardiovascular system: S1 & S2 heard, RRR. No JVD, murmurs, rubs, gallops or clicks. No pedal edema. Gastrointestinal system: Abdomen is nondistended, soft and nontender. No organomegaly or masses felt. Normal bowel sounds heard. Central nervous system: Alert and oriented. No focal neurological deficits. Extremities: Symmetric 5 x 5 power. Skin: No rashes, lesions or ulcers Psychiatry: Judgement and insight appear normal. Mood & affect appropriate.     Data Reviewed: I have personally reviewed following labs and imaging studies  CBC: Recent Labs  Lab 04/17/21 0823 04/18/21  0656  WBC 9.5 9.7  HGB 14.4 15.3  HCT 40.6 44.4  MCV 92.7 91.4  PLT 211 254   Basic Metabolic Panel: Recent Labs  Lab 04/17/21 0823 04/17/21 1641 04/18/21 0002 04/18/21 0656  NA 138  --  140 142  K 3.2*  --  3.7 3.6  CL 104  --  105 106  CO2 24  --  28 27  GLUCOSE 105*  --  106* 104*  BUN 19  --  21 20  CREATININE 1.28*  --  1.42* 1.48*  CALCIUM 8.9  --  8.9 9.1  MG  --  2.1 2.4  --    GFR: Estimated Creatinine Clearance: 56.8 mL/min  (A) (by C-G formula based on SCr of 1.48 mg/dL (H)). Liver Function Tests: Recent Labs  Lab 04/17/21 0823  AST 24  ALT 15  ALKPHOS 76  BILITOT 1.1  PROT 7.9  ALBUMIN 4.0   No results for input(s): LIPASE, AMYLASE in the last 168 hours. No results for input(s): AMMONIA in the last 168 hours. Coagulation Profile: Recent Labs  Lab 04/17/21 0823  INR 1.0   Cardiac Enzymes: No results for input(s): CKTOTAL, CKMB, CKMBINDEX, TROPONINI in the last 168 hours. BNP (last 3 results) No results for input(s): PROBNP in the last 8760 hours. HbA1C: No results for input(s): HGBA1C in the last 72 hours. CBG: No results for input(s): GLUCAP in the last 168 hours. Lipid Profile: No results for input(s): CHOL, HDL, LDLCALC, TRIG, CHOLHDL, LDLDIRECT in the last 72 hours. Thyroid Function Tests: Recent Labs    04/17/21 1641  TSH 2.024   Anemia Panel: No results for input(s): VITAMINB12, FOLATE, FERRITIN, TIBC, IRON, RETICCTPCT in the last 72 hours. Sepsis Labs: No results for input(s): PROCALCITON, LATICACIDVEN in the last 168 hours.  Recent Results (from the past 240 hour(s))  Resp Panel by RT-PCR (Flu A&B, Covid) Nasopharyngeal Swab     Status: None   Collection Time: 04/17/21  8:32 AM   Specimen: Nasopharyngeal Swab; Nasopharyngeal(NP) swabs in vial transport medium  Result Value Ref Range Status   SARS Coronavirus 2 by RT PCR NEGATIVE NEGATIVE Final    Comment: (NOTE) SARS-CoV-2 target nucleic acids are NOT DETECTED.  The SARS-CoV-2 RNA is generally detectable in upper respiratory specimens during the acute phase of infection. The lowest concentration of SARS-CoV-2 viral copies this assay can detect is 138 copies/mL. A negative result does not preclude SARS-Cov-2 infection and should not be used as the sole basis for treatment or other patient management decisions. A negative result may occur with  improper specimen collection/handling, submission of specimen other than  nasopharyngeal swab, presence of viral mutation(s) within the areas targeted by this assay, and inadequate number of viral copies(<138 copies/mL). A negative result must be combined with clinical observations, patient history, and epidemiological information. The expected result is Negative.  Fact Sheet for Patients:  BloggerCourse.com  Fact Sheet for Healthcare Providers:  SeriousBroker.it  This test is no t yet approved or cleared by the [place] FDA and  has been authorized for detection and/or diagnosis of SARS-CoV-2 by FDA under an Emergency Use Authorization (EUA). This EUA will remain  in effect (meaning this test can be used) for the duration of the COVID-19 declaration under Section 564(b)(1) of the Act, 21 U.S.C.section 360bbb-3(b)(1), unless the authorization is terminated  or revoked sooner.       Influenza A by PCR NEGATIVE NEGATIVE Final   Influenza B by PCR NEGATIVE NEGATIVE Final  Comment: (NOTE) The Xpert Xpress SARS-CoV-2/FLU/RSV plus assay is intended as an aid in the diagnosis of influenza from Nasopharyngeal swab specimens and should not be used as a sole basis for treatment. Nasal washings and aspirates are unacceptable for Xpert Xpress SARS-CoV-2/FLU/RSV testing.  Fact Sheet for Patients: BloggerCourse.com  Fact Sheet for Healthcare Providers: SeriousBroker.it  This test is not yet approved or cleared by the [place] FDA and has been authorized for detection and/or diagnosis of SARS-CoV-2 by FDA under an Emergency Use Authorization (EUA). This EUA will remain in effect (meaning this test can be used) for the duration of the COVID-19 declaration under Section 564(b)(1) of the Act, 21 U.S.C. section 360bbb-3(b)(1), unless the authorization is terminated or revoked.  Performed at [facility], [address] [place], [place]  [address]          Radiology Studies: DG Chest 2 View  Result Date: 04/17/2021 CLINICAL DATA:  Shortness of breath EXAM: CHEST - 2 VIEW COMPARISON:  None. FINDINGS: Mildly enlarged cardiac silhouette. Lower lobe predominant, prominent interstitial opacities, with blunting of left costophrenic angle. No acute osseous abnormality. Imaged abdomen is unremarkable. IMPRESSION: Prominent interstitial opacities, most likely pulmonary edema. Electronically Signed   By: Wiliam Ke MD   On: 04/17/2021 08:56        Scheduled Meds:  aspirin EC  81 mg Oral Daily   carvedilol  6.25 mg Oral BID WC   furosemide  40 mg Intravenous Q12H   hydrALAZINE  50 mg Oral Q8H   potassium chloride  20 mEq Oral BID   sacubitril-valsartan  1 tablet Oral BID   sodium chloride flush  3 mL Intravenous Q12H   Continuous Infusions:  sodium chloride     heparin 1,700 Units/hr (04/18/21 0313)     LOS: 1 day    Time spent: 26 minutes    [name], MD [facility]   To contact the attending provider between 7A-7P or the covering provider during after hours 7P-7A, please log into the web site [email or web address] and access using universal [place] password for that web site. If you do not have the password, please call the hospital operator.  04/18/2021, 3:43 PM

## 2021-04-19 LAB — CBC
HCT: 50.4 % (ref 39.0–52.0)
Hemoglobin: 17.2 g/dL — ABNORMAL HIGH (ref 13.0–17.0)
MCH: 31.7 pg (ref 26.0–34.0)
MCHC: 34.1 g/dL (ref 30.0–36.0)
MCV: 92.8 fL (ref 80.0–100.0)
Platelets: 259 10*3/uL (ref 150–400)
RBC: 5.43 MIL/uL (ref 4.22–5.81)
RDW: 12.6 % (ref 11.5–15.5)
WBC: 9.4 10*3/uL (ref 4.0–10.5)
nRBC: 0 % (ref 0.0–0.2)

## 2021-04-19 LAB — BASIC METABOLIC PANEL
Anion gap: 11 (ref 5–15)
BUN: 22 mg/dL (ref 8–23)
CO2: 25 mmol/L (ref 22–32)
Calcium: 9.3 mg/dL (ref 8.9–10.3)
Chloride: 103 mmol/L (ref 98–111)
Creatinine, Ser: 1.58 mg/dL — ABNORMAL HIGH (ref 0.61–1.24)
GFR, Estimated: 49 mL/min — ABNORMAL LOW (ref >60)
Glucose, Bld: 98 mg/dL (ref 70–99)
Potassium: 4 mmol/L (ref 3.5–5.1)
Sodium: 139 mmol/L (ref 135–145)

## 2021-04-19 LAB — MAGNESIUM: Magnesium: 2.2 mg/dL (ref 1.7–2.4)

## 2021-04-19 LAB — HEPARIN LEVEL (UNFRACTIONATED): Heparin Unfractionated: 0.49 IU/mL (ref 0.30–0.70)

## 2021-04-19 MED ORDER — HYDRALAZINE HCL 50 MG PO TABS: 50.0000 mg | ORAL_TABLET | Freq: Three times a day (TID) | ORAL | 0 refills | Status: DC

## 2021-04-19 MED ORDER — SPIRONOLACTONE 25 MG PO TABS: 12.5000 mg | ORAL_TABLET | Freq: Every day | ORAL | 0 refills | Status: DC

## 2021-04-19 MED ORDER — CARVEDILOL 6.25 MG PO TABS: 6.2500 mg | ORAL_TABLET | Freq: Two times a day (BID) | ORAL | 0 refills | Status: DC

## 2021-04-19 MED ORDER — ASPIRIN 81 MG PO TBEC: 81.0000 mg | DELAYED_RELEASE_TABLET | Freq: Every day | ORAL | 0 refills | Status: AC

## 2021-04-19 MED ORDER — FUROSEMIDE 40 MG PO TABS
40.0000 mg | ORAL_TABLET | Freq: Every day | ORAL | Status: DC
  Administered 2021-04-19: 40 mg via ORAL
  Filled 2021-04-19: qty 1

## 2021-04-19 MED ORDER — SACUBITRIL-VALSARTAN 24-26 MG PO TABS: 1.0000 | ORAL_TABLET | Freq: Two times a day (BID) | ORAL | 0 refills | Status: DC

## 2021-04-19 MED ORDER — FUROSEMIDE 40 MG PO TABS
40.0000 mg | ORAL_TABLET | Freq: Every day | ORAL | 0 refills | Status: DC
Start: 2021-04-20 — End: 2021-04-19

## 2021-04-19 MED ORDER — ISOSORBIDE MONONITRATE ER 30 MG PO TB24
30.0000 mg | ORAL_TABLET | Freq: Every day | ORAL | Status: DC
  Administered 2021-04-19: 30 mg via ORAL
  Filled 2021-04-19: qty 1

## 2021-04-19 MED ORDER — SACUBITRIL-VALSARTAN 24-26 MG PO TABS
1.0000 | ORAL_TABLET | Freq: Two times a day (BID) | ORAL | 0 refills | Status: DC
Start: 2021-04-19 — End: 2021-04-20

## 2021-04-19 MED ORDER — ASPIRIN 81 MG PO TBEC: 81.0000 mg | DELAYED_RELEASE_TABLET | Freq: Every day | ORAL | 0 refills | Status: DC

## 2021-04-19 MED ORDER — HYDRALAZINE HCL 50 MG PO TABS
50.0000 mg | ORAL_TABLET | Freq: Three times a day (TID) | ORAL | 0 refills | Status: DC
Start: 2021-04-19 — End: 2021-07-16

## 2021-04-19 MED ORDER — ISOSORBIDE MONONITRATE ER 30 MG PO TB24: 30.0000 mg | ORAL_TABLET | Freq: Every day | ORAL | 0 refills | Status: DC

## 2021-04-19 MED ORDER — SPIRONOLACTONE 25 MG PO TABS
12.5000 mg | ORAL_TABLET | Freq: Every day | ORAL | Status: DC
  Administered 2021-04-19: 12.5 mg via ORAL
  Filled 2021-04-19: qty 0.5
  Filled 2021-04-19: qty 1

## 2021-04-19 MED ORDER — FUROSEMIDE 40 MG PO TABS: 40.0000 mg | ORAL_TABLET | Freq: Every day | ORAL | 0 refills | Status: DC

## 2021-04-19 NOTE — Consult Note (Signed)
ANTICOAGULATION CONSULT NOTE   Pharmacy Consult for heparin Indication: chest pain/ACS  No Known Allergies  Patient Measurements: Height: 6' 0.01" (182.9 cm) Weight: 90.9 kg (200 lb 8 oz) IBW/kg (Calculated) : 77.62 Heparin Dosing Weight: 95.3 kg  Vital Signs: Temp: 98.3 F (36.8 C) (08/13 0432) Temp Source: Oral (08/13 0432) BP: 139/92 (08/13 0432) Pulse Rate: 57 (08/13 0432)  Labs: Recent Labs    04/17/21 0823 04/17/21 1032 04/17/21 1641 04/18/21 0002 04/18/21 0656 04/18/21 0841 04/18/21 1510 04/19/21 0606  HGB 14.4  --   --   --  15.3  --   --  17.2*  HCT 40.6  --   --   --  44.4  --   --  50.4  PLT 211  --   --   --  254  --   --  259  APTT 36  --   --   --   --   --   --   --   LABPROT 13.5  --   --   --   --   --   --   --   INR 1.0  --   --   --   --   --   --   --   HEPARINUNFRC  --   --    < > 0.25*  --  0.42 0.36 0.49  CREATININE 1.28*  --   --  1.42* 1.48*  --   --  1.58*  TROPONINIHS 78* 93*  --   --   --   --   --   --    < > = values in this interval not displayed.     Estimated Creatinine Clearance: 53.2 mL/min (A) (by C-G formula based on SCr of 1.58 mg/dL (H)).   Medical History: History reviewed. No pertinent past medical history.  Medications:  Medications Prior to Admission  Medication Sig Dispense Refill Last Dose   Coenzyme Q10 (CO Q 10) 100 MG CAPS Take 100 mg by mouth daily.      Multiple Vitamins-Minerals (MULTIVITAMIN GUMMIES MENS) CHEW Chew 1 tablet by mouth daily.      Scheduled:  Infusions:  PRN:  Anti-infectives (From admission, onward)    None       Assessment: Pharmacy consulted to start heparin for ACS. No DOAC noted PTA. HS Trop 78. No note of chest pain.   8/11 1641 HL = 0.10 1200 >1500 units/hr 8/12 0002 HL = 0.25, subtherapeutic 8/12 0841 HL = 0.42, therapeutic x1 8/12 1510 HL = 0.36, therapeutic x2 8/13 0606 HL = 0.49, therapeutic x3   Goal of Therapy:  Heparin level 0.3-0.7 units/ml Monitor platelets  by anticoagulation protocol: Yes   Plan:  Continue heparin infusion at 1700 units/hr Check HL with AM labs Continue to monitor H&H and platelets  Otelia Sergeant, PharmD, [facility] 04/19/2021 7:03 AM

## 2021-04-19 NOTE — Progress Notes (Signed)
[facility] Cardiology    SUBJECTIVE: Patient denies any significant chest pain shortness of breath is much improved.  Patient feels well enough to go home on medication advised to follow-up as an outpatient with cardiology.  Patient is doing reasonably well on Entresto and other heart failure medicines   Vitals:   04/18/21 1145 04/18/21 1538 04/18/21 2106 04/19/21 0432  BP: 116/82 132/82 (!) 145/104 (!) 139/92  Pulse: 72 81 (!) 56 (!) 57  Resp: 15 16 17 16   Temp: 98.7 F (37.1 C) 98.8 F (37.1 C) 98.7 F (37.1 C) 98.3 F (36.8 C)  TempSrc: Oral  Oral Oral  SpO2: 99% 96% 100% 100%  Weight:    90.9 kg  Height:         Intake/Output Summary (Last 24 hours) at 04/19/2021 0755 Last data filed at 04/19/2021 0500 Gross per 24 hour  Intake 918.05 ml  Output 2325 ml  Net -1406.95 ml      PHYSICAL EXAM  General: Well developed, well nourished, in no acute distress HEENT:  Normocephalic and atramatic Neck:  No JVD.  Lungs: Clear bilaterally to auscultation and percussion. Heart: HRRR . Normal S1 and S2 without gallops or murmurs.  Abdomen: Bowel sounds are positive, abdomen soft and non-tender  Msk:  Back normal, normal gait. Normal strength and tone for age. Extremities: No clubbing, cyanosis or edema.   Neuro: Alert and oriented X 3. Psych:  Good affect, responds appropriately   LABS: Basic Metabolic Panel: Recent Labs    04/18/21 0002 04/18/21 0656 04/19/21 0606  NA 140 142 139  K 3.7 3.6 4.0  CL 105 106 103  CO2 28 27 25   GLUCOSE 106* 104* 98  BUN 21 20 22   CREATININE 1.42* 1.48* 1.58*  CALCIUM 8.9 9.1 9.3  MG 2.4  --  2.2   Liver Function Tests: Recent Labs    04/17/21 0823  AST 24  ALT 15  ALKPHOS 76  BILITOT 1.1  PROT 7.9  ALBUMIN 4.0   No results for input(s): LIPASE, AMYLASE in the last 72 hours. CBC: Recent Labs    04/18/21 0656 04/19/21 0606  WBC 9.7 9.4  HGB 15.3 17.2*  HCT 44.4 50.4  MCV 91.4 92.8  PLT 254 259   Cardiac Enzymes: No  results for input(s): CKTOTAL, CKMB, CKMBINDEX, TROPONINI in the last 72 hours. BNP: Invalid input(s): POCBNP D-Dimer: No results for input(s): DDIMER in the last 72 hours. Hemoglobin A1C: No results for input(s): HGBA1C in the last 72 hours. Fasting Lipid Panel: No results for input(s): CHOL, HDL, LDLCALC, TRIG, CHOLHDL, LDLDIRECT in the last 72 hours. Thyroid Function Tests: Recent Labs    04/17/21 1641  TSH 2.024   Anemia Panel: No results for input(s): VITAMINB12, FOLATE, FERRITIN, TIBC, IRON, RETICCTPCT in the last 72 hours.  DG Chest 2 View  Result Date: 04/17/2021 CLINICAL DATA:  Shortness of breath EXAM: CHEST - 2 VIEW COMPARISON:  None. FINDINGS: Mildly enlarged cardiac silhouette. Lower lobe predominant, prominent interstitial opacities, with blunting of left costophrenic angle. No acute osseous abnormality. Imaged abdomen is unremarkable. IMPRESSION: Prominent interstitial opacities, most likely pulmonary edema. Electronically Signed   By: 04/21/21 MD   On: 04/17/2021 08:56   ECHOCARDIOGRAM COMPLETE  Result Date: 04/18/2021    ECHOCARDIOGRAM REPORT   Patient Name:   Jonathan Duncan Date of Exam: 04/18/2021 Medical Rec #:  06/18/2021       Height:       72.0 in Accession #:  [id number]      Weight:       201.1 lb Date of Birth:  06/26/1956        BSA:          2.136 m Patient Age:    64 years        BP:           123/88 mmHg Patient Gender: M               HR:           70 bpm. Exam Location:  [facility] Procedure: 2D Echo, Color Doppler and Cardiac Doppler Indications:     I50.21 congestive heart failure-Acute Systolic  History:         Patient has no prior history of Echocardiogram examinations.                  Signs/Symptoms:Shortness of Breath; Risk Factors:Hypertension                  and Family History of Coronary Artery Disease.  Sonographer:     Humphrey RollsJoan Heiss Referring Phys:  [id number] SARA-MAIZ A THOMAS Diagnosing Phys: Alwyn Peawayne D Elayjah Chaney MD  Sonographer Comments: Image  acquisition challenging due to respiratory motion. IMPRESSIONS  1. Mild LVH.  2. Mild/Mod Global Hypokinesis.  3. Mod LVE.  4. Left ventricular ejection fraction, by estimation, is 35 to 40%. The left ventricle has moderately decreased function. The left ventricle demonstrates global hypokinesis. The left ventricular internal cavity size was mildly dilated. There is mild concentric left ventricular hypertrophy. Left ventricular diastolic parameters are consistent with Grade I diastolic dysfunction (impaired relaxation).  5. Right ventricular systolic function is normal. The right ventricular size is normal.  6. The mitral valve is normal in structure. Mild mitral valve regurgitation.  7. The aortic valve is grossly normal. Aortic valve regurgitation is not visualized. Mild aortic valve sclerosis is present, with no evidence of aortic valve stenosis. FINDINGS  Left Ventricle: Left ventricular ejection fraction, by estimation, is 35 to 40%. The left ventricle has moderately decreased function. The left ventricle demonstrates global hypokinesis. The left ventricular internal cavity size was mildly dilated. There is mild concentric left ventricular hypertrophy. Abnormal (paradoxical) septal motion, consistent with left bundle branch block. Left ventricular diastolic parameters are consistent with Grade I diastolic dysfunction (impaired relaxation). Right Ventricle: The right ventricular size is normal. No increase in right ventricular wall thickness. Right ventricular systolic function is normal. Left Atrium: Left atrial size was normal in size. Right Atrium: Right atrial size was normal in size. Pericardium: There is no evidence of pericardial effusion. Mitral Valve: The mitral valve is normal in structure. Mild mitral valve regurgitation. MV peak gradient, 1.5 mmHg. The mean mitral valve gradient is 1.0 mmHg. Tricuspid Valve: The tricuspid valve is normal in structure. Tricuspid valve regurgitation is mild. Aortic  Valve: The aortic valve is grossly normal. Aortic valve regurgitation is not visualized. Mild aortic valve sclerosis is present, with no evidence of aortic valve stenosis. Aortic valve mean gradient measures 5.0 mmHg. Aortic valve peak gradient measures 9.1 mmHg. Aortic valve area, by VTI measures 3.75 cm. Pulmonic Valve: The pulmonic valve was normal in structure. Pulmonic valve regurgitation is not visualized. Aorta: The ascending aorta was not well visualized. IAS/Shunts: No atrial level shunt detected by color flow Doppler. Additional Comments: Mild/Mod Global Hypokinesis. Mild LVH. Mod LVE.  LEFT VENTRICLE PLAX 2D LVIDd:         5.47 cm  Diastology LVIDs:         4.09 cm      LV e' medial:    6.09 cm/s LV PW:         1.57 cm      LV E/e' medial:  7.9 LV IVS:        0.94 cm      LV e' lateral:   9.03 cm/s LVOT diam:     2.70 cm      LV E/e' lateral: 5.4 LV SV:         85 LV SV Index:   40 LVOT Area:     5.73 cm  LV Volumes (MOD) LV vol d, MOD A2C: 149.0 ml LV vol d, MOD A4C: 236.0 ml LV vol s, MOD A2C: 94.0 ml LV vol s, MOD A4C: 170.0 ml LV SV MOD A2C:     55.0 ml LV SV MOD A4C:     236.0 ml LV SV MOD BP:      64.2 ml RIGHT VENTRICLE RV Basal diam:  3.98 cm LEFT ATRIUM             Index       RIGHT ATRIUM           Index LA diam:        3.70 cm 1.73 cm/m  RA Area:     15.30 cm LA Vol (A2C):   86.1 ml 40.32 ml/m RA Volume:   42.20 ml  19.76 ml/m LA Vol (A4C):   29.1 ml 13.63 ml/m LA Biplane Vol: 52.4 ml 24.54 ml/m  AORTIC VALVE                    PULMONIC VALVE AV Area (Vmax):    3.61 cm     PV Vmax:       1.66 m/s AV Area (Vmean):   3.59 cm     PV Vmean:      109.000 cm/s AV Area (VTI):     3.75 cm     PV VTI:        0.269 m AV Vmax:           151.00 cm/s  PV Peak grad:  11.0 mmHg AV Vmean:          103.000 cm/s PV Mean grad:  6.0 mmHg AV VTI:            0.226 m AV Peak Grad:      9.1 mmHg AV Mean Grad:      5.0 mmHg LVOT Vmax:         95.30 cm/s LVOT Vmean:        64.600 cm/s LVOT VTI:           0.148 m LVOT/AV VTI ratio: 0.65  AORTA Ao Root diam: 3.40 cm MITRAL VALVE MV Area (PHT): 5.88 cm    SHUNTS MV Area VTI:   6.38 cm    Systemic VTI:  0.15 m MV Peak grad:  1.5 mmHg    Systemic Diam: 2.70 cm MV Mean grad:  1.0 mmHg MV Vmax:       0.61 m/s MV Vmean:      41.6 cm/s MV Decel Time: 129 msec MV E velocity: 48.40 cm/s MV A velocity: 52.30 cm/s MV E/A ratio:  0.93 Lynsie Mcwatters D Kahner Yanik MD Electronically signed by [name] MD Signature Date/Time: 04/18/2021/6:07:13 PM    Final      Echo moderately depressed left ventricular function  globally EF around 35-40%  TELEMETRY: Normal sinus rhythm rate around 90:  ASSESSMENT AND PLAN:  Active Problems:   CHF (congestive heart failure) (HCC)   Acute on chronic systolic CHF (congestive heart failure) (HCC)   Elevated troponin   Hypertensive emergency Cardiomyopathy Shortness of breath   Plan Recommend increase activity Lasix therapy for heart failure Entresto spironolactone Coreg Imdur and hydralazine Blood pressure control with Entresto spironolactone Coreg hydralazine Recommend functional study or cardiac cath as an outpatient Consider outpatient sleep study for evaluation of obstructive sleep apnea Refer the patient to heart failure clinic Follow-up with cardiology 1 to 2 weeks as an outpatient Okay to discharge home today    [name], MD 04/19/2021 7:55 AM

## 2021-04-19 NOTE — Discharge Summary (Signed)
Physician Discharge Summary  Patient ID: Jonathan Duncan MRN: [id number] DOB/AGE: 1955/10/14 65 y.o.  Admit date: 04/17/2021 Discharge date: 04/19/2021  Admission Diagnoses:  Discharge Diagnoses:  Active Problems:   CHF (congestive heart failure) (HCC)   Acute on chronic systolic CHF (congestive heart failure) (HCC)   Elevated troponin   Hypertensive emergency   Discharged Condition: good  Hospital Course:   Jonathan Duncan is a 65 y.o. male with medical history significant of  Hypertension with noncompliance with medication over the last 2 years due to loss to follow up related to insurance issues, who presents with one week of doe , sob, orthopnea.  He has elevated BNP, he is diagnosed with acute on chronic systolic congestive heart failure, was placed on IV Lasix.  #1.  Acute on chronic systolic congestive heart failure. Elevated troponin secondary to congestive heart failure. Hypertension emergency. Patient has been evaluated by cardiology.  Echocardiogram showed ejection fraction 35 to 40%. Patient still has some volume overload, he is treated with IV Lasix. Condition has improved.  Patient will be medically stable to be discharged.  He will be followed by his PCP and cardiology in the near future.   #2.  Chronic kidney disease stage IIIa versus acute kidney injury. Renal function is stable.   Consults: cardiology  Significant Diagnostic Studies:  1. Mild LVH. 2. Mild/Mod Global Hypokinesis. 3. Mod LVE. Left ventricular ejection fraction, by estimation, is 35 to 40%. The left ventricle has moderately decreased function. The left ventricle demonstrates global hypokinesis. The left ventricular internal cavity size was mildly dilated. There is mild concentric left ventricular hypertrophy. Left ventricular diastolic parameters are consistent with Grade I diastolic dysfunction (impaired relaxation). 4. 5. Right ventricular systolic function is normal. The right ventricular  size is normal. 6. The mitral valve is normal in structure. Mild mitral valve regurgitation. The aortic valve is grossly normal. Aortic valve regurgitation is not visualized. Mild aortic valve sclerosis is present, with no evidence of aortic valve stenosis.    Treatments: IV lasix  Discharge Exam: Blood pressure (!) 141/116, pulse 79, temperature 98.2 F (36.8 C), resp. rate 16, height 6' 0.01" (1.829 m), weight 90.9 kg, SpO2 98 %. General appearance: alert and cooperative Resp: clear to auscultation bilaterally Cardio: regular rate and rhythm, S1, S2 normal, no murmur, click, rub or gallop GI: soft, non-tender; bowel sounds normal; no masses,  no organomegaly Extremities: extremities normal, atraumatic, no cyanosis or edema  Disposition: Discharge disposition: 01-Home or Self Care       Discharge Instructions     Amb Referral to HF Clinic   Complete by: As directed    Diet - low sodium heart healthy   Complete by: As directed    Increase activity slowly   Complete by: As directed       Allergies as of 04/19/2021   No Known Allergies      Medication List     TAKE these medications    aspirin 81 MG EC tablet Take 1 tablet (81 mg total) by mouth daily. Swallow whole. Start taking on: April 20, 2021   carvedilol 6.25 MG tablet Commonly known as: COREG Take 1 tablet (6.25 mg total) by mouth 2 (two) times daily with a meal.   Co Q 10 100 MG Caps Take 100 mg by mouth daily.   furosemide 40 MG tablet Commonly known as: LASIX Take 1 tablet (40 mg total) by mouth daily. Start taking on: April 20, 2021   hydrALAZINE 50 MG tablet  Commonly known as: APRESOLINE Take 1 tablet (50 mg total) by mouth every 8 (eight) hours.   isosorbide mononitrate 30 MG 24 hr tablet Commonly known as: IMDUR Take 1 tablet (30 mg total) by mouth daily. Start taking on: April 20, 2021   Multivitamin Gummies Mens [place] 1 tablet by mouth daily.   sacubitril-valsartan 24-26  MG Commonly known as: ENTRESTO Take 1 tablet by mouth 2 (two) times daily.   spironolactone 25 MG tablet Commonly known as: ALDACTONE Take 0.5 tablets (12.5 mg total) by mouth daily. Start taking on: April 20, 2021        Follow-up Information     [name], MD. Call in 3 day(s).   Specialties: Cardiology, Internal Medicine Why: Call Monday to Follow Up within 1-2 weeks. Contact information: [address] [place] [address] [phone]         Center, [facility] Follow up in 1 week(s).   Contact information: [address] [place] [address] [phone]                 Signed: [name] 04/19/2021, 10:57 AM

## 2021-04-20 MED ORDER — LOSARTAN POTASSIUM 50 MG PO TABS: 50.0000 mg | ORAL_TABLET | Freq: Every day | ORAL | 11 refills | Status: DC

## 2021-04-20 NOTE — Consult Note (Signed)
CARDIOLOGY CONSULT NOTE               Patient ID: Jonathan Duncan MRN: [id number] DOB/AGE: 12-27-1955 65 y.o.  Admit date: 04/17/2021 Referring Physician Dr. Dorena Bodo hospitalist Primary Physician [facility] Primary Cardiologist [facility] Reason for Consultation congestive heart failure shortness of breath  HPI: Patient is a 65 year old male history of hypertension noncompliant over at least the past 2 years patient states he lost his insurance and and was not able to continue his medication patient started having shortness of breath over the last several days and got progressively worse that he was dyspneic at rest denies any chest pain but had PND and orthopnea minimal leg swelling but significant dyspnea.  Patient was found to have elevated blood pressure and slightly elevated troponins EKG showed bundle branch block with no old EKG to compare with his elevated blood pressure and what appears to be significant heart failure.  Cardiology was consulted for further assessment evaluation patient does not smoke denies any previous cardiac history except hypertension  Review of systems complete and found to be negative unless listed above     History reviewed. No pertinent past medical history.  History reviewed. No pertinent surgical history.  No medications prior to admission.   Social History   Socioeconomic History   Marital status: Married    Spouse name: Not on file   Number of children: Not on file   Years of education: Not on file   Highest education level: Not on file  Occupational History   Not on file  Tobacco Use   Smoking status: Never   Smokeless tobacco: Never  Substance and Sexual Activity   Alcohol use: Not on file   Drug use: Not on file   Sexual activity: Not on file  Other Topics Concern   Not on file  Social History Narrative   Not on file   Social Determinants of Health   Financial Resource Strain: Not on file  Food  Insecurity: Not on file  Transportation Needs: Not on file  Physical Activity: Not on file  Stress: Not on file  Social Connections: Not on file  Intimate Partner Violence: Not on file    History reviewed. No pertinent family history.    Review of systems complete and found to be negative unless listed above      PHYSICAL EXAM  General: Well developed, well nourished, in no acute distress HEENT:  Normocephalic and atramatic Neck:  No JVD.  Lungs: Clear bilaterally to auscultation and percussion. Heart: HRRR . Normal S1 and S2 without gallops or murmurs.  Abdomen: Bowel sounds are positive, abdomen soft and non-tender  Msk:  Back normal, normal gait. Normal strength and tone for age. Extremities: No clubbing, cyanosis or edema.   Neuro: Alert and oriented X 3. Psych:  Good affect, responds appropriately  Labs:   Lab Results  Component Value Date   WBC 9.4 04/19/2021   HGB 17.2 (H) 04/19/2021   HCT 50.4 04/19/2021   MCV 92.8 04/19/2021   PLT 259 04/19/2021    Recent Labs  Lab 04/17/21 0823 04/18/21 0002 04/19/21 0606  NA 138   < > 139  K 3.2*   < > 4.0  CL 104   < > 103  CO2 24   < > 25  BUN 19   < > 22  CREATININE 1.28*   < > 1.58*  CALCIUM 8.9   < > 9.3  PROT 7.9  --   --  BILITOT 1.1  --   --   ALKPHOS 76  --   --   ALT 15  --   --   AST 24  --   --   GLUCOSE 105*   < > 98   < > = values in this interval not displayed.   No results found for: CKTOTAL, CKMB, CKMBINDEX, TROPONINI No results found for: CHOL No results found for: HDL No results found for: LDLCALC No results found for: TRIG No results found for: CHOLHDL No results found for: LDLDIRECT    Radiology: DG Chest 2 View  Result Date: 04/17/2021 CLINICAL DATA:  Shortness of breath EXAM: CHEST - 2 VIEW COMPARISON:  None. FINDINGS: Mildly enlarged cardiac silhouette. Lower lobe predominant, prominent interstitial opacities, with blunting of left costophrenic angle. No acute osseous abnormality.  Imaged abdomen is unremarkable. IMPRESSION: Prominent interstitial opacities, most likely pulmonary edema. Electronically Signed   By: Wiliam Ke MD   On: 04/17/2021 08:56   ECHOCARDIOGRAM COMPLETE  Result Date: 04/18/2021    ECHOCARDIOGRAM REPORT   Patient Name:   Jonathan Duncan Date of Exam: 04/18/2021 Medical Rec #:  [id number]       Height:       72.0 in Accession #:    [id number]      Weight:       201.1 lb Date of Birth:  1956-02-07        BSA:          2.136 m Patient Age:    64 years        BP:           123/88 mmHg Patient Gender: M               HR:           70 bpm. Exam Location:  [facility] Procedure: 2D Echo, Color Doppler and Cardiac Doppler Indications:     I50.21 congestive heart failure-Acute Systolic  History:         Patient has no prior history of Echocardiogram examinations.                  Signs/Symptoms:Shortness of Breath; Risk Factors:Hypertension                  and Family History of Coronary Artery Disease.  Sonographer:     Humphrey Rolls Referring Phys:  [id number] SARA-MAIZ A THOMAS Diagnosing Phys: [name] MD  Sonographer Comments: Image acquisition challenging due to respiratory motion. IMPRESSIONS  1. Mild LVH.  2. Mild/Mod Global Hypokinesis.  3. Mod LVE.  4. Left ventricular ejection fraction, by estimation, is 35 to 40%. The left ventricle has moderately decreased function. The left ventricle demonstrates global hypokinesis. The left ventricular internal cavity size was mildly dilated. There is mild concentric left ventricular hypertrophy. Left ventricular diastolic parameters are consistent with Grade I diastolic dysfunction (impaired relaxation).  5. Right ventricular systolic function is normal. The right ventricular size is normal.  6. The mitral valve is normal in structure. Mild mitral valve regurgitation.  7. The aortic valve is grossly normal. Aortic valve regurgitation is not visualized. Mild aortic valve sclerosis is present, with no evidence of aortic valve  stenosis. FINDINGS  Left Ventricle: Left ventricular ejection fraction, by estimation, is 35 to 40%. The left ventricle has moderately decreased function. The left ventricle demonstrates global hypokinesis. The left ventricular internal cavity size was mildly dilated. There is mild concentric left ventricular hypertrophy. Abnormal (paradoxical) septal  motion, consistent with left bundle branch block. Left ventricular diastolic parameters are consistent with Grade I diastolic dysfunction (impaired relaxation). Right Ventricle: The right ventricular size is normal. No increase in right ventricular wall thickness. Right ventricular systolic function is normal. Left Atrium: Left atrial size was normal in size. Right Atrium: Right atrial size was normal in size. Pericardium: There is no evidence of pericardial effusion. Mitral Valve: The mitral valve is normal in structure. Mild mitral valve regurgitation. MV peak gradient, 1.5 mmHg. The mean mitral valve gradient is 1.0 mmHg. Tricuspid Valve: The tricuspid valve is normal in structure. Tricuspid valve regurgitation is mild. Aortic Valve: The aortic valve is grossly normal. Aortic valve regurgitation is not visualized. Mild aortic valve sclerosis is present, with no evidence of aortic valve stenosis. Aortic valve mean gradient measures 5.0 mmHg. Aortic valve peak gradient measures 9.1 mmHg. Aortic valve area, by VTI measures 3.75 cm. Pulmonic Valve: The pulmonic valve was normal in structure. Pulmonic valve regurgitation is not visualized. Aorta: The ascending aorta was not well visualized. IAS/Shunts: No atrial level shunt detected by color flow Doppler. Additional Comments: Mild/Mod Global Hypokinesis. Mild LVH. Mod LVE.  LEFT VENTRICLE PLAX 2D LVIDd:         5.47 cm      Diastology LVIDs:         4.09 cm      LV e' medial:    6.09 cm/s LV PW:         1.57 cm      LV E/e' medial:  7.9 LV IVS:        0.94 cm      LV e' lateral:   9.03 cm/s LVOT diam:     2.70 cm       LV E/e' lateral: 5.4 LV SV:         85 LV SV Index:   40 LVOT Area:     5.73 cm  LV Volumes (MOD) LV vol d, MOD A2C: 149.0 ml LV vol d, MOD A4C: 236.0 ml LV vol s, MOD A2C: 94.0 ml LV vol s, MOD A4C: 170.0 ml LV SV MOD A2C:     55.0 ml LV SV MOD A4C:     236.0 ml LV SV MOD BP:      64.2 ml RIGHT VENTRICLE RV Basal diam:  3.98 cm LEFT ATRIUM             Index       RIGHT ATRIUM           Index LA diam:        3.70 cm 1.73 cm/m  RA Area:     15.30 cm LA Vol (A2C):   86.1 ml 40.32 ml/m RA Volume:   42.20 ml  19.76 ml/m LA Vol (A4C):   29.1 ml 13.63 ml/m LA Biplane Vol: 52.4 ml 24.54 ml/m  AORTIC VALVE                    PULMONIC VALVE AV Area (Vmax):    3.61 cm     PV Vmax:       1.66 m/s AV Area (Vmean):   3.59 cm     PV Vmean:      109.000 cm/s AV Area (VTI):     3.75 cm     PV VTI:        0.269 m AV Vmax:           151.00 cm/s  PV Peak  grad:  11.0 mmHg AV Vmean:          103.000 cm/s PV Mean grad:  6.0 mmHg AV VTI:            0.226 m AV Peak Grad:      9.1 mmHg AV Mean Grad:      5.0 mmHg LVOT Vmax:         95.30 cm/s LVOT Vmean:        64.600 cm/s LVOT VTI:          0.148 m LVOT/AV VTI ratio: 0.65  AORTA Ao Root diam: 3.40 cm MITRAL VALVE MV Area (PHT): 5.88 cm    SHUNTS MV Area VTI:   6.38 cm    Systemic VTI:  0.15 m MV Peak grad:  1.5 mmHg    Systemic Diam: 2.70 cm MV Mean grad:  1.0 mmHg MV Vmax:       0.61 m/s MV Vmean:      41.6 cm/s MV Decel Time: 129 msec MV E velocity: 48.40 cm/s MV A velocity: 52.30 cm/s MV E/A ratio:  0.93 Analia Zuk D Winola Drum MD Electronically signed by Alwyn Peawayne D Georganna Maxson MD Signature Date/Time: 04/18/2021/6:07:13 PM    Final     EKG: Left bundle branch block rate of 80  ASSESSMENT AND PLAN:  Acute congestive heart failure Shortness of breath Left bundle branch block Poorly controlled hypertension Borderline troponins Possible obstructive sleep apnea Acute respiratory failure Noncompliance History of acute pulm edema . Plan Agree with admit to telemetry Follow-up  EKGs and troponins Troponin suggest demand ischemia not a non-STEMI Acute congestive heart failure we will recommend echocardiogram to assess whether systolic or diastolic of both Diuretic therapy IV Lasix to help with scongestive heart failure Recommend ARB ACE or Entresto plus beta-blocker plus spironolactone as well as diuretic Consider sleep study for possible obstructive sleep apnea Consider ischemia work-up either functional study or cardiac cath but can be probably done as an outpatient  Signed: Alwyn Peawayne D Casidy Alberta MD 04/20/2021, 1:42 PM

## 2021-04-25 ENCOUNTER — Telehealth: Payer: Self-pay

## 2021-04-28 NOTE — Telephone Encounter (Signed)
  Made two phone calls to [facility] for post hospital follow up, each time left a message on his answering machine.  Tresa Endo RN CHFN

## 2021-05-01 ENCOUNTER — Ambulatory Visit: Payer: Self-pay | Admitting: Family

## 2021-05-01 ENCOUNTER — Telehealth: Payer: Self-pay | Admitting: Family

## 2021-05-01 NOTE — Telephone Encounter (Signed)
Patient did not show for his Heart Failure Clinic appointment on 05/01/21. Will attempt to reschedule.   

## 2021-05-01 NOTE — Progress Notes (Deleted)
   Patient ID: Jonathan Duncan, male    DOB: 22-Mar-1956, 65 y.o.   MRN: [id number]  HPI  Jonathan Duncan is a 65 y/o male with a history of  Echo report from 04/18/21 reviewed and showed an EF of 35-40% along with mild LVH and mild Jonathan.   Admitted 04/17/21 due to heart failure and HTN. Initially given IV lasix with transition to oral medications. Cardiology consult obtained. Discharged after 2 days.   He presents today for his initial visit with a chief complaint of   Review of Systems    Physical Exam    Assessment & Plan:  1: Chronic heart failure with reduced ejection fraction- - NYHA class - BNP 04/17/21 was 653.7  2: HTN- - BP - BMP 04/19/21 showed sodium 139, potassium 4.0, creatinine 1.58 and GFR 49

## 2021-05-20 NOTE — Progress Notes (Signed)
Patient ID: Jonathan Duncan, male    DOB: March 06, 1956, 65 y.o.   MRN: [id number]  HPI  Mr Venuto is a 65 y/o male with a history of HTN and heart failure.   Echo report from 04/18/21 reviewed and showed an EF of 35-40% along with mild LVH and mild MR.   Stress test done 05/01/21 showed an EF of 30% with severe LVH.   Admitted 04/17/21 due to heart failure and HTN. Initially given IV lasix with transition to oral medications. Cardiology consult obtained. Discharged after 2 days.   He presents today for his initial visit with a chief complaint of minimal fatigue upon moderate exertion. He says that this has been present for several weeks. He has no other symptoms and specifically denies any difficulty sleeping, dizziness, abdominal distention, palpitations, pedal edema, chest pain, shortness of breath or cough.   Has scales at home but hasn't been weighing daily.   Waiting to hear about when his LHC will be scheduled.   Past Medical History:  Diagnosis Date   CHF (congestive heart failure) (HCC)    Hypertension    History reviewed. No pertinent surgical history. History reviewed. No pertinent family history. Social History   Tobacco Use   Smoking status: Never   Smokeless tobacco: Never  Substance Use Topics   Alcohol use: Not on file   No Known Allergies Prior to Admission medications   Medication Sig Start Date End Date Taking? Authorizing Provider  aspirin 81 MG EC tablet Take 1 tablet (81 mg total) by mouth daily. Swallow whole. 04/20/21  Yes [name], MD  hydrALAZINE (APRESOLINE) 50 MG tablet Take 1 tablet (50 mg total) by mouth every 8 (eight) hours. 04/19/21  Yes [name], MD  carvedilol (COREG) 6.25 MG tablet Take 1 tablet (6.25 mg total) by mouth 2 (two) times daily with a meal. 05/22/21   Clarisa Kindred A, FNP  Coenzyme Q10 (CO Q 10) 100 MG CAPS Take 100 mg by mouth daily. Patient not taking: Reported on 05/22/2021    [provider]  furosemide (LASIX) 40  MG tablet Take 1 tablet (40 mg total) by mouth daily. 05/22/21   Delma Freeze, FNP  isosorbide mononitrate (IMDUR) 30 MG 24 hr tablet Take 1 tablet (30 mg total) by mouth daily. 05/22/21   Delma Freeze, FNP  losartan (COZAAR) 50 MG tablet Take 1 tablet (50 mg total) by mouth daily. 05/22/21 05/22/22  Delma Freeze, FNP  spironolactone (ALDACTONE) 25 MG tablet Take 0.5 tablets (12.5 mg total) by mouth daily. 05/22/21   Delma Freeze, FNP    Review of Systems  Constitutional:  Positive for fatigue. Negative for appetite change.  HENT:  Negative for congestion, postnasal drip and sore throat.   Eyes: Negative.   Respiratory:  Negative for cough, chest tightness and shortness of breath.   Cardiovascular:  Negative for chest pain, palpitations and leg swelling.  Gastrointestinal:  Negative for abdominal distention and abdominal pain.  Endocrine: Negative.   Genitourinary: Negative.   Musculoskeletal:  Negative for back pain and neck pain.  Skin: Negative.   Allergic/Immunologic: Negative.   Neurological:  Negative for dizziness and light-headedness.  Hematological:  Negative for adenopathy. Does not bruise/bleed easily.  Psychiatric/Behavioral:  Negative for dysphoric mood and sleep disturbance. The patient is not nervous/anxious.    Vitals:   05/22/21 1434  BP: (!) 142/67  Pulse: (!) 51  Resp: 18  SpO2: 100%  Weight: 201 lb 8 oz (91.4  kg)  Height: 6' (1.829 m)   Wt Readings from Last 3 Encounters:  05/22/21 201 lb 8 oz (91.4 kg)  04/19/21 200 lb 8 oz (90.9 kg)  11/11/17 192 lb (87.1 kg)   Lab Results  Component Value Date   CREATININE 1.58 (H) 04/19/2021   CREATININE 1.48 (H) 04/18/2021   CREATININE 1.42 (H) 04/18/2021    Physical Exam Vitals and nursing note reviewed.  Constitutional:      Appearance: Normal appearance.  HENT:     Head: Normocephalic and atraumatic.  Cardiovascular:     Rate and Rhythm: Regular rhythm. Bradycardia present.  Pulmonary:     Effort:  Pulmonary effort is normal. No respiratory distress.     Breath sounds: No wheezing or rales.  Abdominal:     General: There is no distension.     Palpations: Abdomen is soft.  Musculoskeletal:        General: No tenderness.     Cervical back: Normal range of motion and neck supple.     Right lower leg: No edema.     Left lower leg: No edema.  Skin:    General: Skin is warm and dry.  Neurological:     General: No focal deficit present.     Mental Status: He is alert and oriented to person, place, and time.  Psychiatric:        Mood and Affect: Mood normal.        Behavior: Behavior normal.        Thought Content: Thought content normal.    Assessment & Plan:  1: Chronic heart failure with reduced ejection fraction- - NYHA class II - euvolemic today - has scales at home; instructed to start weighing every morning, write the weight down and call for an overnight weight gain of > 2 pounds or a weekly weight gain of > 5 pounds - not adding salt and has recently started reading food labels for sodium content; understands to keep his daily sodium content to 1500-2000mg  / day; low sodium cookbook provided to him today - saw cardiology Cliffton Asters) 05/21/21 - on GDMT of carvedilol, losartan and spironolactone - depending on LHC results, consider changing losartan to entresto and adding SGLT2 - BNP 04/17/21 was 653.7  2: HTN- - BP looks good today - sees PCP at [facility] - BMP 04/19/21 showed sodium 139, potassium 4.0, creatinine 1.58 and GFR 49   Medication bottles reviewed.   Return in 6 weeks or sooner for any questions/problems before then.

## 2021-05-22 ENCOUNTER — Telehealth: Payer: Self-pay | Admitting: Family

## 2021-05-22 ENCOUNTER — Encounter: Payer: Self-pay | Admitting: Family

## 2021-05-22 ENCOUNTER — Ambulatory Visit: Payer: Self-pay | Attending: Family | Admitting: Family

## 2021-05-22 ENCOUNTER — Other Ambulatory Visit: Payer: Self-pay

## 2021-05-22 VITALS — BP 142/67 | HR 51 | Resp 18 | Ht 72.0 in | Wt 201.5 lb

## 2021-05-22 DIAGNOSIS — Z79899 Other long term (current) drug therapy: Secondary | ICD-10-CM | POA: Insufficient documentation

## 2021-05-22 DIAGNOSIS — I5022 Chronic systolic (congestive) heart failure: Secondary | ICD-10-CM

## 2021-05-22 DIAGNOSIS — I10 Essential (primary) hypertension: Secondary | ICD-10-CM

## 2021-05-22 DIAGNOSIS — I110 Hypertensive heart disease with heart failure: Secondary | ICD-10-CM | POA: Insufficient documentation

## 2021-05-22 MED ORDER — ISOSORBIDE MONONITRATE ER 30 MG PO TB24: 30.0000 mg | ORAL_TABLET | Freq: Every day | ORAL | 5 refills | Status: AC

## 2021-05-22 MED ORDER — LOSARTAN POTASSIUM 50 MG PO TABS: 50.0000 mg | ORAL_TABLET | Freq: Every day | ORAL | 5 refills | Status: DC

## 2021-05-22 MED ORDER — CARVEDILOL 6.25 MG PO TABS: 6.2500 mg | ORAL_TABLET | Freq: Two times a day (BID) | ORAL | 5 refills | Status: AC

## 2021-05-22 MED ORDER — SPIRONOLACTONE 25 MG PO TABS: 12.5000 mg | ORAL_TABLET | Freq: Every day | ORAL | 5 refills | Status: AC

## 2021-05-22 MED ORDER — FUROSEMIDE 40 MG PO TABS: 40.0000 mg | ORAL_TABLET | Freq: Every day | ORAL | 5 refills | Status: AC

## 2021-05-22 NOTE — Telephone Encounter (Deleted)
Patient did not show for his Heart Failure Clinic appointment on 05/22/21. Will attempt to reschedule.   

## 2021-05-22 NOTE — Telephone Encounter (Signed)
Patient was marked a NS for his appointment. He arrived 30 minutes late but we were able to go ahead and still see him.

## 2021-05-22 NOTE — Patient Instructions (Addendum)
Begin weighing daily and call for an overnight weight gain of > 2 pounds or a weekly weight gain of >5 pounds. 

## 2021-06-30 NOTE — Progress Notes (Deleted)
Patient ID: Jonathan Duncan, male    DOB: 11-22-1955, 65 y.o.   MRN: [id number]  HPI  Mr Davenport is a 65 y/o male with a history of HTN and heart failure.   Echo report from 04/18/21 reviewed and showed an EF of 35-40% along with mild LVH and mild MR.   Stress test done 05/01/21 showed an EF of 30% with severe LVH.   Admitted 04/17/21 due to heart failure and HTN. Initially given IV lasix with transition to oral medications. Cardiology consult obtained. Discharged after 2 days.   He presents today for a follow-up visit with a chief complaint of   Waiting to hear about when his LHC will be scheduled.   Past Medical History:  Diagnosis Date   CHF (congestive heart failure) (HCC)    Hypertension    No past surgical history on file. No family history on file. Social History   Tobacco Use   Smoking status: Never   Smokeless tobacco: Never  Substance Use Topics   Alcohol use: Not on file   No Known Allergies   Review of Systems  Constitutional:  Positive for fatigue. Negative for appetite change.  HENT:  Negative for congestion, postnasal drip and sore throat.   Eyes: Negative.   Respiratory:  Negative for cough, chest tightness and shortness of breath.   Cardiovascular:  Negative for chest pain, palpitations and leg swelling.  Gastrointestinal:  Negative for abdominal distention and abdominal pain.  Endocrine: Negative.   Genitourinary: Negative.   Musculoskeletal:  Negative for back pain and neck pain.  Skin: Negative.   Allergic/Immunologic: Negative.   Neurological:  Negative for dizziness and light-headedness.  Hematological:  Negative for adenopathy. Does not bruise/bleed easily.  Psychiatric/Behavioral:  Negative for dysphoric mood and sleep disturbance. The patient is not nervous/anxious.       Physical Exam Vitals and nursing note reviewed.  Constitutional:      Appearance: Normal appearance.  HENT:     Head: Normocephalic and atraumatic.  Cardiovascular:      Rate and Rhythm: Regular rhythm. Bradycardia present.  Pulmonary:     Effort: Pulmonary effort is normal. No respiratory distress.     Breath sounds: No wheezing or rales.  Abdominal:     General: There is no distension.     Palpations: Abdomen is soft.  Musculoskeletal:        General: No tenderness.     Cervical back: Normal range of motion and neck supple.     Right lower leg: No edema.     Left lower leg: No edema.  Skin:    General: Skin is warm and dry.  Neurological:     General: No focal deficit present.     Mental Status: He is alert and oriented to person, place, and time.  Psychiatric:        Mood and Affect: Mood normal.        Behavior: Behavior normal.        Thought Content: Thought content normal.    Assessment & Plan:  1: Chronic heart failure with reduced ejection fraction- - NYHA class II - euvolemic today - has scales at home; instructed to start weighing every morning, write the weight down and call for an overnight weight gain of > 2 pounds or a weekly weight gain of > 5 pounds - weight 201.8 pounds from last visit here 6 weeks ago - not adding salt and has recently started reading food labels for sodium content; understands  to keep his daily sodium content to 1500-2000mg  / day - saw cardiology Juliann Pares) 06/23/21 - on GDMT of carvedilol, losartan and spironolactone - depending on LHC results, consider changing losartan to entresto and adding SGLT2 - BNP 04/17/21 was 653.7  2: HTN- - BP - sees PCP at [facility] - BMP 04/19/21 showed sodium 139, potassium 4.0, creatinine 1.58 and GFR 49   Medication bottles reviewed.

## 2021-07-01 ENCOUNTER — Ambulatory Visit: Payer: Self-pay | Admitting: Family

## 2021-07-16 ENCOUNTER — Other Ambulatory Visit: Payer: Self-pay

## 2021-07-16 ENCOUNTER — Encounter: Payer: Self-pay | Admitting: Family

## 2021-07-16 ENCOUNTER — Ambulatory Visit: Payer: Self-pay | Attending: Family | Admitting: Family

## 2021-07-16 VITALS — BP 119/71 | HR 51 | Resp 20 | Ht 72.0 in | Wt 200.1 lb

## 2021-07-16 DIAGNOSIS — Z79899 Other long term (current) drug therapy: Secondary | ICD-10-CM | POA: Insufficient documentation

## 2021-07-16 DIAGNOSIS — I10 Essential (primary) hypertension: Secondary | ICD-10-CM

## 2021-07-16 DIAGNOSIS — I5022 Chronic systolic (congestive) heart failure: Secondary | ICD-10-CM | POA: Insufficient documentation

## 2021-07-16 DIAGNOSIS — I110 Hypertensive heart disease with heart failure: Secondary | ICD-10-CM | POA: Insufficient documentation

## 2021-07-16 MED ORDER — SACUBITRIL-VALSARTAN 24-26 MG PO TABS: 1.0000 | ORAL_TABLET | Freq: Two times a day (BID) | ORAL | 3 refills | Status: AC

## 2021-07-16 NOTE — Progress Notes (Signed)
Patient ID: Jonathan Duncan, male    DOB: 1956/02/18, 65 y.o.   MRN: [id number]  Mr Authement is a 65 y/o male with a history of HTN and heart failure.   Echo report from 04/18/21 reviewed and showed an EF of 35-40% along with mild LVH and mild MR.   Stress test done 05/01/21 showed an EF of 30% with severe LVH.   Admitted 04/17/21 due to heart failure and HTN. Initially given IV lasix with transition to oral medications. Cardiology consult obtained. Discharged after 2 days.   He presents today for a follow up visit with a chief complaint of minimal fatigue upon moderate exertion. He says that this has been present for several weeks and is improving. He has no other symptoms and specifically denies any difficulty sleeping, dizziness, abdominal distention, palpitations, pedal edema, chest pain, shortness of breath or cough.   Has scales at home but hasn't been weighing daily, however he reports his main symptom last time was SOB and orthopnea.   Still needs to schedule an appointment with new PCP.    Past Medical History:  Diagnosis Date   CHF (congestive heart failure) (HCC)    Hypertension    No past surgical history on file. No family history on file. Social History   Tobacco Use   Smoking status: Never   Smokeless tobacco: Never  Substance Use Topics   Alcohol use: Not on file   No Known Allergies  Prior to Admission medications   Medication Sig Start Date End Date Taking? Authorizing Provider  aspirin 81 MG EC tablet Take 1 tablet (81 mg total) by mouth daily. Swallow whole. 04/20/21  Yes [name], MD  hydrALAZINE (APRESOLINE) 50 MG tablet Take 1 tablet (50 mg total) by mouth every 8 (eight) hours. 04/19/21  Yes [name], MD  carvedilol (COREG) 6.25 MG tablet Take 1 tablet (6.25 mg total) by mouth 2 (two) times daily with a meal. 05/22/21   Clarisa Kindred A, FNP  Coenzyme Q10 (CO Q 10) 100 MG CAPS Take 100 mg by mouth daily. Patient not taking: Reported on 05/22/2021     [provider]  furosemide (LASIX) 40 MG tablet Take 1 tablet (40 mg total) by mouth daily. 05/22/21   Delma Freeze, FNP  isosorbide mononitrate (IMDUR) 30 MG 24 hr tablet Take 1 tablet (30 mg total) by mouth daily. 05/22/21   Delma Freeze, FNP  losartan (COZAAR) 50 MG tablet Take 1 tablet (50 mg total) by mouth daily. 05/22/21 05/22/22  Delma Freeze, FNP  spironolactone (ALDACTONE) 25 MG tablet Take 0.5 tablets (12.5 mg total) by mouth daily. 05/22/21   Delma Freeze, FNP    Review of Systems  Constitutional:  Positive for fatigue (improving). Negative for appetite change.  HENT:  Negative for congestion, postnasal drip and sore throat.   Eyes: Negative.   Respiratory:  Negative for cough, chest tightness and shortness of breath.   Cardiovascular:  Negative for chest pain, palpitations and leg swelling.  Gastrointestinal:  Negative for abdominal distention and abdominal pain.  Endocrine: Negative.   Genitourinary: Negative.   Musculoskeletal:  Negative for back pain and neck pain.  Skin: Negative.   Allergic/Immunologic: Negative.   Neurological:  Negative for dizziness and light-headedness.  Hematological:  Negative for adenopathy. Does not bruise/bleed easily.  Psychiatric/Behavioral:  Negative for dysphoric mood and sleep disturbance. The patient is not nervous/anxious.    Vitals:   07/16/21 1038  BP: 119/71  Pulse: Marland Kitchen)  51  Resp: 20  SpO2: 100%  Weight: 200 lb 2 oz (90.8 kg)  Height: 6' (1.829 m)   Wt Readings from Last 3 Encounters:  07/16/21 200 lb 2 oz (90.8 kg)  05/22/21 201 lb 8 oz (91.4 kg)  04/19/21 200 lb 8 oz (90.9 kg)   Lab Results  Component Value Date   CREATININE 1.58 (H) 04/19/2021   CREATININE 1.48 (H) 04/18/2021   CREATININE 1.42 (H) 04/18/2021    Physical Exam Vitals and nursing note reviewed.  Constitutional:      General: He is not in acute distress.    Appearance: Normal appearance. He is not ill-appearing.  HENT:     Head:  Normocephalic and atraumatic.  Cardiovascular:     Rate and Rhythm: Regular rhythm. Bradycardia present.  Pulmonary:     Effort: Pulmonary effort is normal. No respiratory distress.     Breath sounds: No wheezing or rales.  Abdominal:     General: There is no distension.     Palpations: Abdomen is soft.  Musculoskeletal:        General: No tenderness.     Cervical back: Normal range of motion and neck supple.     Right lower leg: No edema.     Left lower leg: No edema.  Skin:    General: Skin is warm and dry.  Neurological:     General: No focal deficit present.     Mental Status: He is alert and oriented to person, place, and time.  Psychiatric:        Mood and Affect: Mood normal.        Behavior: Behavior normal.        Thought Content: Thought content normal.    Assessment & Plan:  1: Chronic heart failure with reduced ejection fraction- - NYHA class II - euvolemic today - has scales at home; weighs intermittently; encouraged to weigh daily and call for overnight weight gain of > 2 pounds or a weekly weight gain of > 5 pounds - weight stable from last visit here 6 weeks ago - not adding salt and has recently started reading food labels for sodium content; understands to keep his daily sodium content to 1500-2000mg /day - saw cardiology Cliffton Asters) 06/23/21 - on GDMT of carvedilol, losartan and spironolactone - finish current losartan bottle, then change to Lezlie Lye voucher provided  - BNP 04/17/21 was 653.7 - provided information to get [facility]  2: HTN- - BP looks good today 119/71 - provided contact information for Dr. Judithann Sheen to establish PCP - BMP 04/19/21 showed sodium 139, potassium 4.0, creatinine 1.58 and GFR 49 - has been out of apresoline for several weeks, BP is good, will d/c     Medication bottles reviewed.   Return in 6 weeks or sooner for any questions/problems before then.

## 2021-07-16 NOTE — Patient Instructions (Addendum)
Please call Dr. Judithann Sheen office to schedule a new patient appointment at [phone]  Stop by [facility] (This is a pharmacy)  You are ok to not take hydralazine anymore (bottle we threw away)  Finish current bottle of losartaan. After you have completed this bottle, start Entresto twice/day.  Return in 2 months to HF clinic.

## 2021-09-13 NOTE — Progress Notes (Deleted)
Patient ID: Jonathan Duncan, male    DOB: 05/19/1956, 66 y.o.   MRN: [id number]  Mr Tozzi is a 66 y/o male with a history of HTN and heart failure.   Echo report from 04/18/21 reviewed and showed an EF of 35-40% along with mild LVH and mild MR.   Stress test done 05/01/21 showed an EF of 30% with severe LVH.   Admitted 04/17/21 due to heart failure and HTN. Initially given IV lasix with transition to oral medications. Cardiology consult obtained. Discharged after 2 days.   He presents today for a follow up visit with a chief complaint of   Past Medical History:  Diagnosis Date   CHF (congestive heart failure) (HCC)    Hypertension    No past surgical history on file. No family history on file. Social History   Tobacco Use   Smoking status: Never   Smokeless tobacco: Never  Substance Use Topics   Alcohol use: Not on file   No Known Allergies    Review of Systems  Constitutional:  Positive for fatigue (improving). Negative for appetite change.  HENT:  Negative for congestion, postnasal drip and sore throat.   Eyes: Negative.   Respiratory:  Negative for cough, chest tightness and shortness of breath.   Cardiovascular:  Negative for chest pain, palpitations and leg swelling.  Gastrointestinal:  Negative for abdominal distention and abdominal pain.  Endocrine: Negative.   Genitourinary: Negative.   Musculoskeletal:  Negative for back pain and neck pain.  Skin: Negative.   Allergic/Immunologic: Negative.   Neurological:  Negative for dizziness and light-headedness.  Hematological:  Negative for adenopathy. Does not bruise/bleed easily.  Psychiatric/Behavioral:  Negative for dysphoric mood and sleep disturbance. The patient is not nervous/anxious.       Physical Exam Vitals and nursing note reviewed.  Constitutional:      General: He is not in acute distress.    Appearance: Normal appearance. He is not ill-appearing.  HENT:     Head: Normocephalic and atraumatic.   Cardiovascular:     Rate and Rhythm: Regular rhythm. Bradycardia present.  Pulmonary:     Effort: Pulmonary effort is normal. No respiratory distress.     Breath sounds: No wheezing or rales.  Abdominal:     General: There is no distension.     Palpations: Abdomen is soft.  Musculoskeletal:        General: No tenderness.     Cervical back: Normal range of motion and neck supple.     Right lower leg: No edema.     Left lower leg: No edema.  Skin:    General: Skin is warm and dry.  Neurological:     General: No focal deficit present.     Mental Status: He is alert and oriented to person, place, and time.  Psychiatric:        Mood and Affect: Mood normal.        Behavior: Behavior normal.        Thought Content: Thought content normal.    Assessment & Plan:  1: Chronic heart failure with reduced ejection fraction- - NYHA class II - euvolemic today - has scales at home; weighs intermittently; encouraged to weigh daily and call for overnight weight gain of > 2 pounds or a weekly weight gain of > 5 pounds - weight 200.2 from last visit here 2 months ago - not adding salt and has recently started reading food labels for sodium content; understands to keep his  daily sodium content to 1500-2000mg /day - saw cardiology Cliffton Asters) 06/23/21 - on GDMT of carvedilol, losartan and spironolactone - finish current losartan bottle, then change to [facility] - will get labs today as he's now taking entresto - BNP 04/17/21 was 653.7 - provided information to get [facility]  2: HTN- - BP - provided contact information for Dr. Judithann Sheen to establish PCP - BMP 04/19/21 showed sodium 139, potassium 4.0, creatinine 1.58 and GFR 49     Medication bottles reviewed.

## 2021-09-15 ENCOUNTER — Telehealth: Payer: Self-pay | Admitting: Family

## 2021-09-15 ENCOUNTER — Ambulatory Visit: Payer: Self-pay | Admitting: Family

## 2021-09-15 NOTE — Telephone Encounter (Signed)
Patient did not show for his [facility] appointment on 09/15/21. Will attempt to reschedule.   °

## 2021-09-24 DIAGNOSIS — Z20822 Contact with and (suspected) exposure to covid-19: Secondary | ICD-10-CM | POA: Diagnosis not present

## 2021-09-24 DIAGNOSIS — Z1152 Encounter for screening for COVID-19: Secondary | ICD-10-CM | POA: Diagnosis not present

## 2021-09-24 DIAGNOSIS — J069 Acute upper respiratory infection, unspecified: Secondary | ICD-10-CM | POA: Diagnosis not present

## 2021-12-11 DIAGNOSIS — R69 Illness, unspecified: Secondary | ICD-10-CM | POA: Diagnosis not present

## 2021-12-11 DIAGNOSIS — Z1159 Encounter for screening for other viral diseases: Secondary | ICD-10-CM | POA: Diagnosis not present

## 2021-12-11 DIAGNOSIS — K130 Diseases of lips: Secondary | ICD-10-CM | POA: Diagnosis not present

## 2021-12-11 DIAGNOSIS — Z114 Encounter for screening for human immunodeficiency virus [HIV]: Secondary | ICD-10-CM | POA: Diagnosis not present

## 2021-12-11 DIAGNOSIS — R2241 Localized swelling, mass and lump, right lower limb: Secondary | ICD-10-CM | POA: Diagnosis not present

## 2021-12-11 DIAGNOSIS — I5020 Unspecified systolic (congestive) heart failure: Secondary | ICD-10-CM | POA: Diagnosis not present

## 2021-12-11 DIAGNOSIS — Z1211 Encounter for screening for malignant neoplasm of colon: Secondary | ICD-10-CM | POA: Diagnosis not present

## 2022-01-01 DIAGNOSIS — D485 Neoplasm of uncertain behavior of skin: Secondary | ICD-10-CM | POA: Diagnosis not present

## 2022-01-07 DIAGNOSIS — I10 Essential (primary) hypertension: Secondary | ICD-10-CM | POA: Diagnosis not present

## 2022-01-07 DIAGNOSIS — I5020 Unspecified systolic (congestive) heart failure: Secondary | ICD-10-CM | POA: Diagnosis not present

## 2022-01-07 DIAGNOSIS — E785 Hyperlipidemia, unspecified: Secondary | ICD-10-CM | POA: Diagnosis not present

## 2022-01-13 DIAGNOSIS — R229 Localized swelling, mass and lump, unspecified: Secondary | ICD-10-CM | POA: Diagnosis not present

## 2022-02-10 DIAGNOSIS — R2241 Localized swelling, mass and lump, right lower limb: Secondary | ICD-10-CM | POA: Diagnosis not present

## 2022-02-10 DIAGNOSIS — R229 Localized swelling, mass and lump, unspecified: Secondary | ICD-10-CM | POA: Diagnosis not present

## 2022-02-15 DIAGNOSIS — R2241 Localized swelling, mass and lump, right lower limb: Secondary | ICD-10-CM | POA: Diagnosis not present

## 2022-02-19 DIAGNOSIS — D481 Neoplasm of uncertain behavior of connective and other soft tissue: Secondary | ICD-10-CM | POA: Diagnosis not present

## 2022-03-04 DIAGNOSIS — R9439 Abnormal result of other cardiovascular function study: Secondary | ICD-10-CM | POA: Diagnosis not present

## 2022-03-04 DIAGNOSIS — G4733 Obstructive sleep apnea (adult) (pediatric): Secondary | ICD-10-CM | POA: Diagnosis not present

## 2022-03-04 DIAGNOSIS — Z01818 Encounter for other preprocedural examination: Secondary | ICD-10-CM | POA: Diagnosis not present

## 2022-03-04 DIAGNOSIS — R2241 Localized swelling, mass and lump, right lower limb: Secondary | ICD-10-CM | POA: Diagnosis not present

## 2022-03-04 DIAGNOSIS — I110 Hypertensive heart disease with heart failure: Secondary | ICD-10-CM | POA: Diagnosis not present

## 2022-03-04 DIAGNOSIS — I5020 Unspecified systolic (congestive) heart failure: Secondary | ICD-10-CM | POA: Diagnosis not present

## 2022-03-05 DIAGNOSIS — I10 Essential (primary) hypertension: Secondary | ICD-10-CM | POA: Diagnosis not present

## 2022-03-05 DIAGNOSIS — I5020 Unspecified systolic (congestive) heart failure: Secondary | ICD-10-CM | POA: Diagnosis not present

## 2022-03-05 DIAGNOSIS — Z01818 Encounter for other preprocedural examination: Secondary | ICD-10-CM | POA: Diagnosis not present

## 2022-03-05 DIAGNOSIS — G4733 Obstructive sleep apnea (adult) (pediatric): Secondary | ICD-10-CM | POA: Diagnosis not present

## 2022-03-23 DIAGNOSIS — R351 Nocturia: Secondary | ICD-10-CM | POA: Diagnosis not present

## 2022-03-23 DIAGNOSIS — G4733 Obstructive sleep apnea (adult) (pediatric): Secondary | ICD-10-CM | POA: Diagnosis not present

## 2022-03-23 DIAGNOSIS — D481 Neoplasm of uncertain behavior of connective and other soft tissue: Secondary | ICD-10-CM | POA: Diagnosis not present

## 2022-03-23 DIAGNOSIS — I5020 Unspecified systolic (congestive) heart failure: Secondary | ICD-10-CM | POA: Diagnosis not present

## 2022-03-23 DIAGNOSIS — D2121 Benign neoplasm of connective and other soft tissue of right lower limb, including hip: Secondary | ICD-10-CM | POA: Diagnosis not present

## 2022-03-23 DIAGNOSIS — Z79899 Other long term (current) drug therapy: Secondary | ICD-10-CM | POA: Diagnosis not present

## 2022-03-23 DIAGNOSIS — I428 Other cardiomyopathies: Secondary | ICD-10-CM | POA: Diagnosis not present

## 2022-03-23 DIAGNOSIS — I447 Left bundle-branch block, unspecified: Secondary | ICD-10-CM | POA: Diagnosis not present

## 2022-03-23 DIAGNOSIS — I110 Hypertensive heart disease with heart failure: Secondary | ICD-10-CM | POA: Diagnosis not present

## 2022-04-07 DIAGNOSIS — R001 Bradycardia, unspecified: Secondary | ICD-10-CM | POA: Diagnosis not present

## 2022-04-07 DIAGNOSIS — I10 Essential (primary) hypertension: Secondary | ICD-10-CM | POA: Diagnosis not present

## 2022-04-07 DIAGNOSIS — G4733 Obstructive sleep apnea (adult) (pediatric): Secondary | ICD-10-CM | POA: Diagnosis not present

## 2022-04-07 DIAGNOSIS — I5020 Unspecified systolic (congestive) heart failure: Secondary | ICD-10-CM | POA: Diagnosis not present

## 2022-04-22 DIAGNOSIS — I5020 Unspecified systolic (congestive) heart failure: Secondary | ICD-10-CM | POA: Diagnosis not present

## 2022-04-24 DIAGNOSIS — I5020 Unspecified systolic (congestive) heart failure: Secondary | ICD-10-CM | POA: Diagnosis not present

## 2022-04-29 ENCOUNTER — Other Ambulatory Visit: Payer: Self-pay

## 2022-04-29 DIAGNOSIS — Z4801 Encounter for change or removal of surgical wound dressing: Secondary | ICD-10-CM | POA: Insufficient documentation

## 2022-04-29 NOTE — ED Triage Notes (Signed)
Pt presents to ER with c/o possible post op preblem.  Pt states he had mass removed from lower right leg on 7/17 at [facility].  Pt states he missed follow up appt and has not had wound checked out since the procedure.  Pt does have open wound noted to RLE that pt states was stitched up.  Pt endorses some drainage from area.  No blood noted at this time.  Pt is A&O x4 at this time in NAD in triage.

## 2022-04-30 ENCOUNTER — Emergency Department
Admission: EM | Admit: 2022-04-30 | Discharge: 2022-04-30 | Disposition: A | Discharge status: Home / Self Care | Payer: 59 | Attending: Emergency Medicine | Admitting: Emergency Medicine

## 2022-04-30 DIAGNOSIS — Z5189 Encounter for other specified aftercare: Secondary | ICD-10-CM

## 2022-04-30 MED ORDER — BACITRACIN ZINC 500 UNIT/GM EX OINT
TOPICAL_OINTMENT | CUTANEOUS | Status: AC
  Administered 2022-04-30: 1 via TOPICAL
  Filled 2022-04-30: qty 0.9

## 2022-04-30 MED ORDER — MUPIROCIN 2 % EX OINT: 1.0000 | TOPICAL_OINTMENT | Freq: Two times a day (BID) | CUTANEOUS | 0 refills | Status: AC

## 2022-04-30 MED ORDER — DOXYCYCLINE HYCLATE 100 MG PO CAPS: 100.0000 mg | ORAL_CAPSULE | Freq: Two times a day (BID) | ORAL | 0 refills | Status: AC

## 2022-04-30 NOTE — Discharge Instructions (Signed)
As we discussed, there might be a very small amount of infection in your wound, and we wrote you a prescription for antibiotics and for an antibiotic ointment you can use (just apply a thin layer twice a day when you change the dressing).  But otherwise the wound needs to heal from the inside out since it opened up at some point over the last month.  I removed a large piece of suture material that was still present in the wound and had not dissolved, and that should help with any inflammation that was resulting from having the sutures still present.  Please gently wash the wound twice a day, keep it clean and dry with a thin layer of the mupirocin ointment and a nonadherent dressing, and follow-up as planned with your surgeon.  Return to the emergency department if you develop new or worsening symptoms that concern you.

## 2022-04-30 NOTE — ED Provider Notes (Signed)
[facility] Provider Note    Event Date/Time   First MD Initiated Contact with Patient 04/30/22 0302     (approximate)   History   Post-op Problem   HPI  Jonathan Duncan is a 66 y.o. male who presents for a wound check.  He reports that about a month ago he was supposed to have a dermatologist remove the lesion on his right lower leg.  However the dermatologist found that it was deeper and more complicated than the dermatologist originally expected, so the patient was referred to a surgeon at [facility].  He had the lesion removed and the wound was sutured.  They were told that the suture material will dissolve.  He was supposed to follow-up in clinic but apparently there was a miscommunication or somehow the patient did not follow-up as planned.  Since that time the wound has opened back up and still has some suture material coming out of it.  He saw his primary care doctor a couple of weeks ago and was told to just keep it clean and dry and continue to use some ointment on it (he has been using Neosporin).  It has not changed significantly, but he is worried about the appearance and wonders if it might be infected.  He has a follow-up appointment with the surgeon, but not for another 2 weeks.  He denies fever/chills, nausea, vomiting, and any other significant change in systemic status.  He has had no swelling to the leg and no spreading redness.  No significant tenderness to palpation or pain.     Physical Exam   Triage Vital Signs: ED Triage Vitals  Enc Vitals Group     BP 04/29/22 2211 (!) 186/99     Pulse Rate 04/29/22 2211 61     Resp 04/29/22 2211 16     Temp 04/29/22 2211 98.6 F (37 C)     Temp Source 04/29/22 2211 Oral     SpO2 04/29/22 2211 94 %     Weight --      Height --      Head Circumference --      Peak Flow --      Pain Score 04/29/22 2211 3     Pain Loc --      Pain Edu? --      Excl. in GC? --     Most recent vital signs: Vitals:    04/29/22 2211 04/30/22 0335  BP: (!) 186/99 (!) 166/89  Pulse: 61 (!) 110  Resp: 16 18  Temp: 98.6 F (37 C) 97.9 F (36.6 C)  SpO2: 94% 100%     General: Awake, no distress.  CV:  Good peripheral perfusion.  Resp:  Normal effort.  Abd:  No distention.  Other:  Opened but subacute or chronic appearing wound on the right lower leg.  There was a large amount of suture material that appeared to be part of 1 continuous running suture that was still present in the wound and coming out both sides.  With gentle pressure I was able to fully remove it and there does not appear to be any remaining suture material within the wound.  There is also no foreign material.  There is 1 small area of purulence shown below in the photo at approximately the 0-mark on the far left side.  This was where the suture material was coming back up and out of the skin.  There is no active purulence within the main  body of the wound and the whitish material seen appears to be granulation tissue.  I was able to swab away some small amount of purulent material but there is no induration, fluctuance, or evidence of active infection other than the small area of purulence on the left side of the photo below.     ED Results / Procedures / Treatments   Labs (all labs ordered are listed, but only abnormal results are displayed) Labs Reviewed - No data to display     PROCEDURES:  Critical Care performed: No  Procedures   MEDICATIONS ORDERED IN ED: Medications  bacitracin ointment (has no administration in time range)     IMPRESSION / MDM / ASSESSMENT AND PLAN / ED COURSE  I reviewed the triage vital signs and the nursing notes.                              Differential diagnosis includes, but is not limited to, wound infection, normal healing process, retained foreign body, abscess, phlegmon.  Patient's presentation is most consistent with exacerbation of chronic illness.  Patient is overall  well-appearing and in no distress.  He was initially mildly tachycardic but I believe that was situational and not related to systemic illness.  He has had no systemic symptoms and in fact the wound has been essentially unchanged for about a month.  It sounds as if it dehisced at some point but is not actively infected other than a possible small area as shown below.  However I think it might be related to inflammation from the persistent suture material.  As documented above, I removed the suture material and examined for any sign of fluctuance or induration or purulence.  There is only a small area of pus which I swabbed away.  Given the extended period of time it has been open and the question of purulent material, I talked it over with the patient and his wife, and we agreed to try an empiric course of doxycycline.  I also wrote him a prescription for mupirocin that he can use instead of Neosporin, and I also suggested avoiding Neosporin given the possibility of an inflammatory reaction.  I recommended that he follow-up as scheduled with his surgeon but there is no indication for hospitalization or additional treatment at this time.  He agrees with the plan.  I gave my usual and customary return precautions.       FINAL CLINICAL IMPRESSION(S) / ED DIAGNOSES   Final diagnoses:  Visit for wound check     Rx / DC Orders   ED Discharge Orders          Ordered    mupirocin ointment (BACTROBAN) 2 %  2 times daily        04/30/22 0505    doxycycline (VIBRAMYCIN) 100 MG capsule  2 times daily        04/30/22 0505             Note:  This document was prepared using Dragon voice recognition software and may include unintentional dictation errors.   Loleta Rose, MD 04/30/22 [phone]

## 2022-05-02 IMAGING — CR DG CHEST 2V
1 series · 2 of 2 positions shown · non-contrast
Comparison: None.

CLINICAL DATA: Shortness of breath

EXAM:
CHEST - 2 VIEW

[Series 1: dg chest 2 view · 0.14mm/px · 2 of 2 slices shown]
[im 1/2]
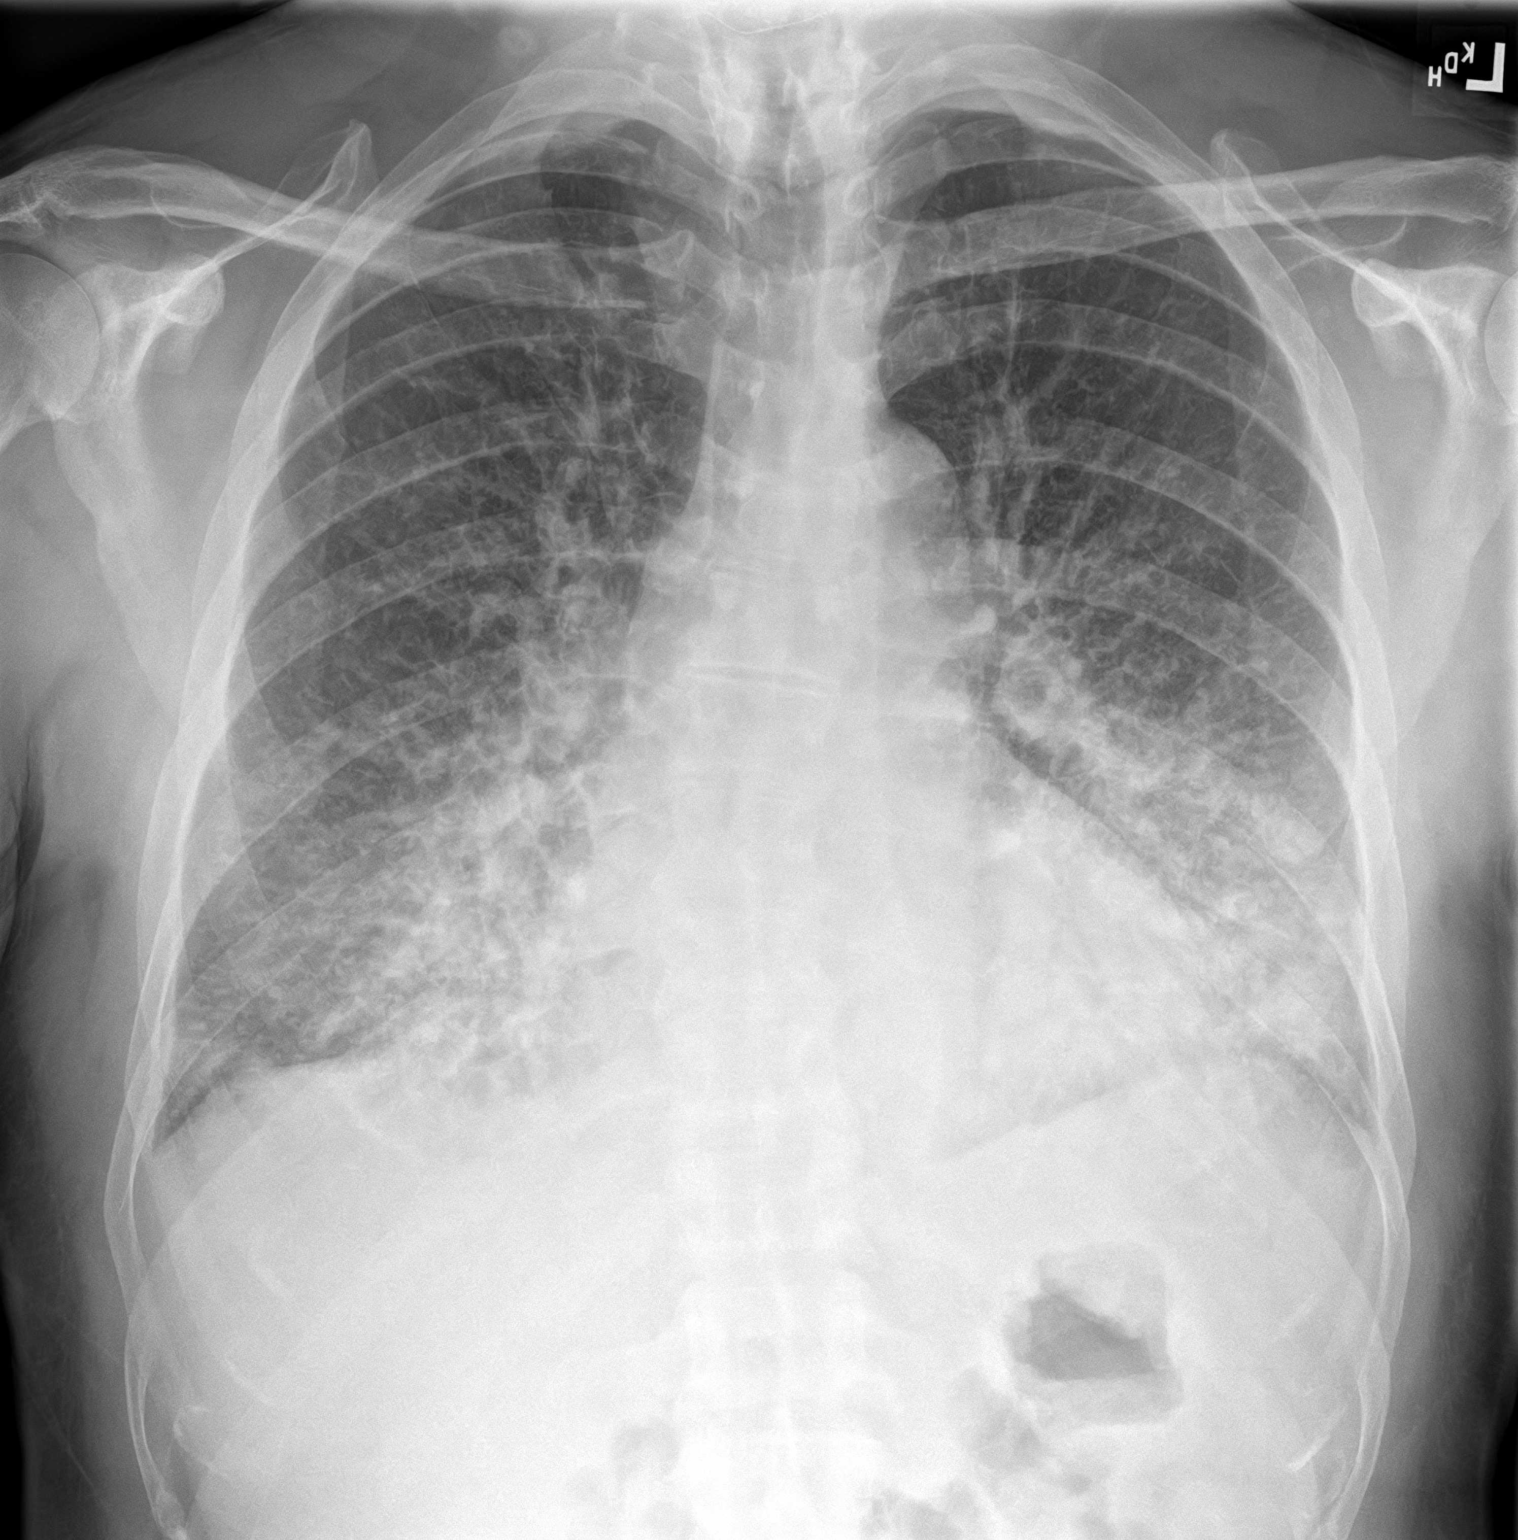
[im 2/2]
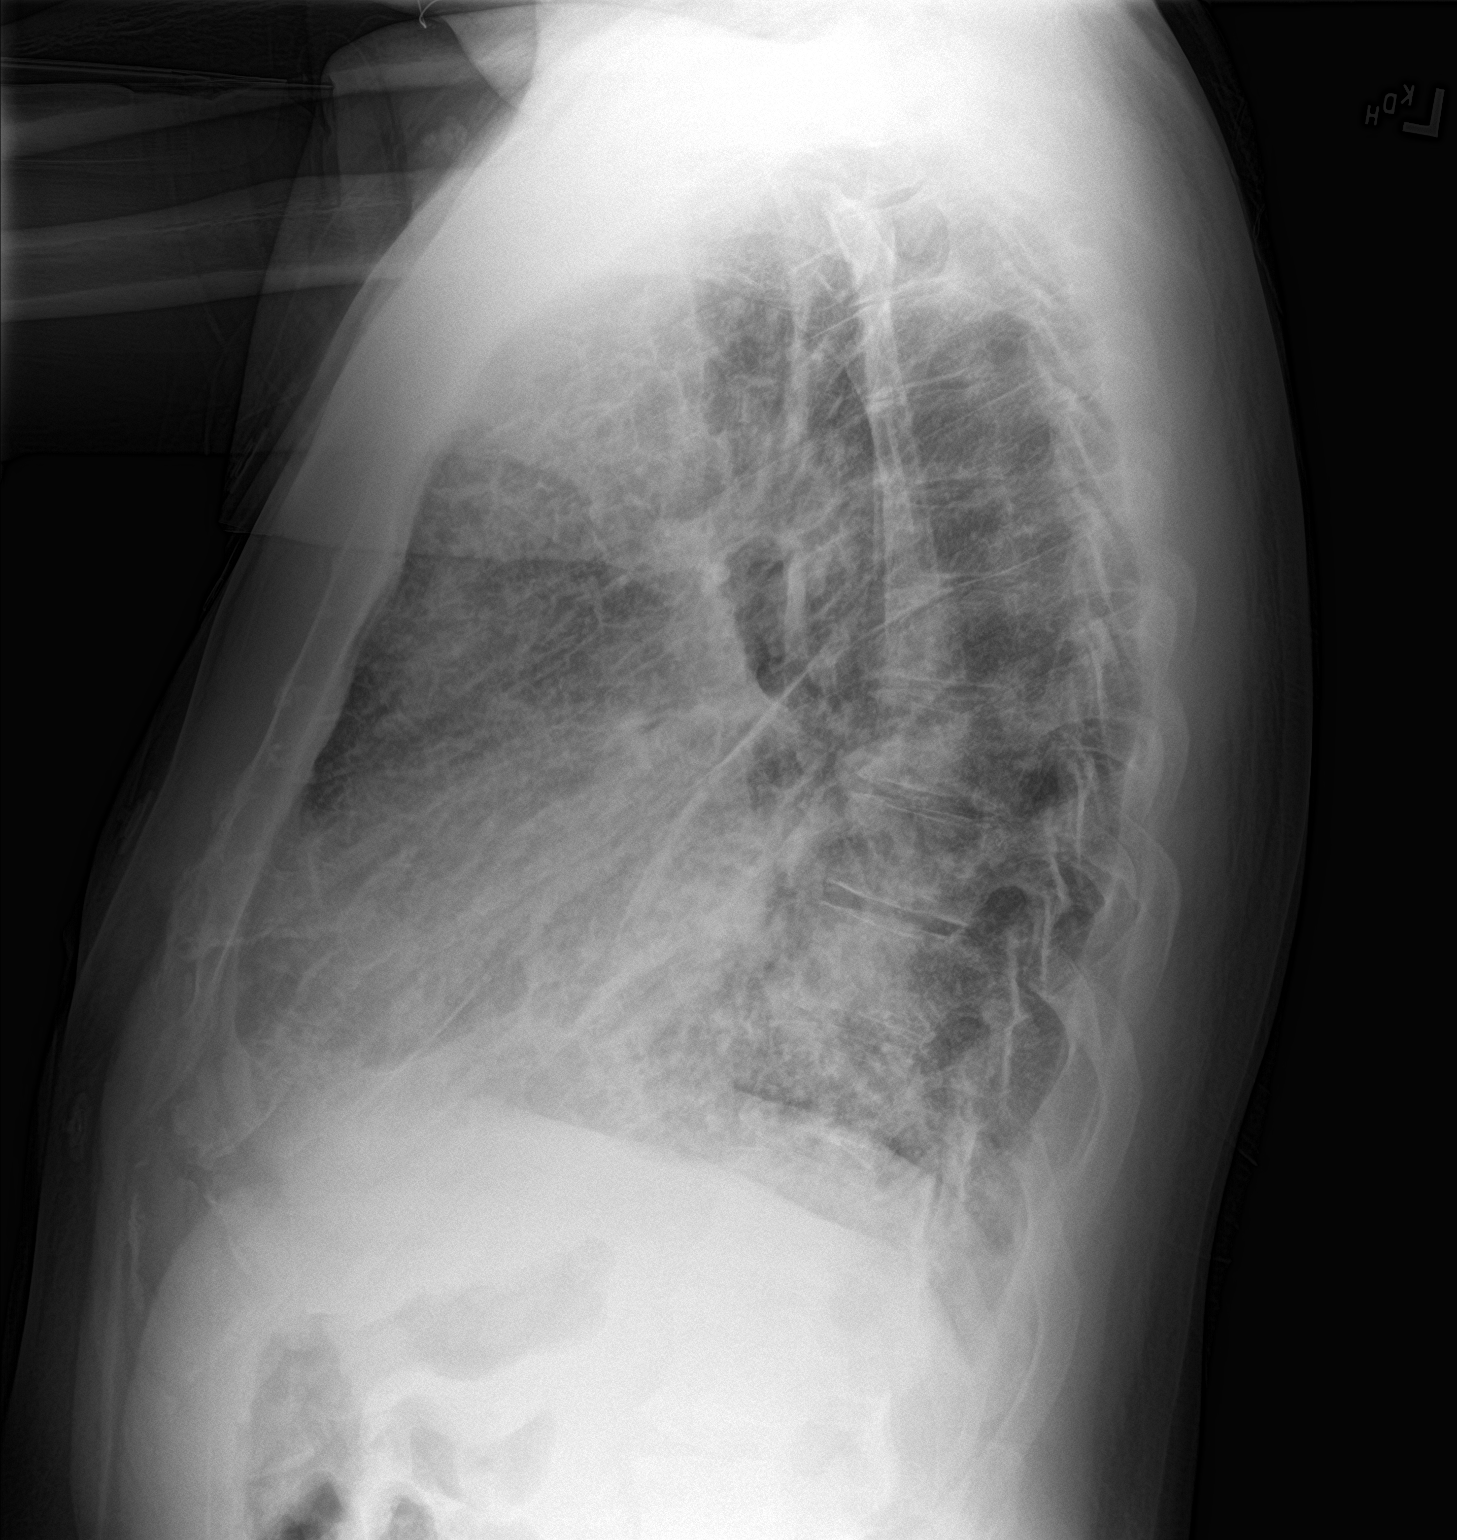

[2 of 2 positions shown; findings below may reference images not displayed]

FINDINGS: Mildly enlarged cardiac silhouette. Lower lobe predominant,
prominent interstitial opacities, with blunting of left costophrenic
angle. No acute osseous abnormality. Imaged abdomen is unremarkable.
IMPRESSION: Prominent interstitial opacities, most likely pulmonary edema.

## 2022-05-04 DIAGNOSIS — Z79899 Other long term (current) drug therapy: Secondary | ICD-10-CM | POA: Diagnosis not present

## 2022-05-04 DIAGNOSIS — I5020 Unspecified systolic (congestive) heart failure: Secondary | ICD-10-CM | POA: Diagnosis not present

## 2022-05-04 DIAGNOSIS — G4733 Obstructive sleep apnea (adult) (pediatric): Secondary | ICD-10-CM | POA: Diagnosis not present

## 2022-05-04 DIAGNOSIS — R001 Bradycardia, unspecified: Secondary | ICD-10-CM | POA: Diagnosis not present

## 2022-05-04 DIAGNOSIS — I10 Essential (primary) hypertension: Secondary | ICD-10-CM | POA: Diagnosis not present

## 2022-05-14 DIAGNOSIS — D481 Neoplasm of uncertain behavior of connective and other soft tissue: Secondary | ICD-10-CM | POA: Diagnosis not present

## 2022-05-14 DIAGNOSIS — R2241 Localized swelling, mass and lump, right lower limb: Secondary | ICD-10-CM | POA: Diagnosis not present
